# Patient Record
Sex: Female | Born: 1980 | Race: White | Hispanic: No | Marital: Single | Smoking: Never smoker
Health system: Southern US, Community
[De-identification: ages and names within clinical notes are randomized; demographics above are authoritative.]

## PROBLEM LIST (undated history)

## (undated) DIAGNOSIS — C439 Malignant melanoma of skin, unspecified: Secondary | ICD-10-CM

## (undated) DIAGNOSIS — T7840XA Allergy, unspecified, initial encounter: Secondary | ICD-10-CM

## (undated) DIAGNOSIS — J45909 Unspecified asthma, uncomplicated: Secondary | ICD-10-CM

## (undated) HISTORY — PX: TONSILLECTOMY: SHX5217

## (undated) HISTORY — DX: Allergy, unspecified, initial encounter: T78.40XA

## (undated) HISTORY — DX: Malignant melanoma of skin, unspecified: C43.9

## (undated) HISTORY — PX: TYMPANOSTOMY TUBE PLACEMENT: SHX32

## (undated) HISTORY — DX: Unspecified asthma, uncomplicated: J45.909

---

## 2000-08-14 ENCOUNTER — Other Ambulatory Visit: Admission: RE | Admit: 2000-08-14 | Discharge: 2000-08-14 | Payer: Self-pay | Admitting: Gynecology

## 2001-07-25 ENCOUNTER — Other Ambulatory Visit: Admission: RE | Admit: 2001-07-25 | Discharge: 2001-07-25 | Payer: Self-pay | Admitting: Gynecology

## 2003-11-10 ENCOUNTER — Emergency Department (HOSPITAL_COMMUNITY): Admission: EM | Admit: 2003-11-10 | Discharge: 2003-11-10 | Payer: Self-pay | Admitting: Emergency Medicine

## 2003-11-10 ENCOUNTER — Inpatient Hospital Stay (HOSPITAL_COMMUNITY): Admission: RE | Admit: 2003-11-10 | Discharge: 2003-11-23 | Payer: Self-pay | Admitting: Psychiatry

## 2003-11-11 ENCOUNTER — Ambulatory Visit (HOSPITAL_COMMUNITY): Admission: RE | Admit: 2003-11-11 | Discharge: 2003-11-11 | Payer: Self-pay | Admitting: Psychiatry

## 2003-11-24 ENCOUNTER — Other Ambulatory Visit (HOSPITAL_COMMUNITY): Admission: RE | Admit: 2003-11-24 | Discharge: 2003-11-28 | Payer: Self-pay | Admitting: Psychiatry

## 2003-12-12 ENCOUNTER — Encounter: Admission: RE | Admit: 2003-12-12 | Discharge: 2003-12-12 | Payer: Self-pay | Admitting: Psychiatry

## 2011-11-17 ENCOUNTER — Telehealth: Payer: Self-pay

## 2011-11-17 MED ORDER — BECLOMETHASONE DIPROPIONATE 40 MCG/ACT IN AERS: 1.0000 | INHALATION_SPRAY | Freq: Two times a day (BID) | RESPIRATORY_TRACT | Status: DC

## 2011-11-17 MED ORDER — AMPHETAMINE-DEXTROAMPHET ER 20 MG PO CP24: 20.0000 mg | ORAL_CAPSULE | ORAL | Status: DC

## 2011-11-17 NOTE — Telephone Encounter (Signed)
Chart is at nurses station for review MR [id number]

## 2011-11-17 NOTE — Telephone Encounter (Signed)
DEA database clear.  Adderall Rx done and ready for pick up. Steroid inhaler changed, she did not give [place] a pharmacy to send it to so please call it in.  Lauren Gibbs

## 2011-11-17 NOTE — Telephone Encounter (Signed)
Pt wants refill of Adderall. Also needs to change inhaler as ins will no longer cover Pulmacort. Needs a generic daily steroid inhaler (not a rescue).

## 2011-11-19 NOTE — Telephone Encounter (Signed)
lmom for pt to cb with pharmacy.  adderall rx is in pickup drawer

## 2011-11-20 ENCOUNTER — Other Ambulatory Visit: Payer: Self-pay | Admitting: *Deleted

## 2011-11-20 MED ORDER — BECLOMETHASONE DIPROPIONATE 40 MCG/ACT IN AERS: 1.0000 | INHALATION_SPRAY | Freq: Two times a day (BID) | RESPIRATORY_TRACT | Status: DC

## 2011-11-20 NOTE — Telephone Encounter (Signed)
NOTED ON RX IN PICKUP DRAWER TO LET us KNOW WHAT PHARMACY TO CALL HER INHALER INTO

## 2012-03-02 ENCOUNTER — Ambulatory Visit (INDEPENDENT_AMBULATORY_CARE_PROVIDER_SITE_OTHER): Payer: BC Managed Care – PPO | Admitting: Family Medicine

## 2012-03-02 ENCOUNTER — Encounter: Payer: Self-pay | Admitting: Family Medicine

## 2012-03-02 VITALS — BP 102/70 | HR 88 | Temp 99.7°F | Resp 16 | Ht 71.0 in | Wt 160.0 lb

## 2012-03-02 DIAGNOSIS — F988 Other specified behavioral and emotional disorders with onset usually occurring in childhood and adolescence: Secondary | ICD-10-CM

## 2012-03-02 DIAGNOSIS — J309 Allergic rhinitis, unspecified: Secondary | ICD-10-CM | POA: Insufficient documentation

## 2012-03-02 DIAGNOSIS — J45909 Unspecified asthma, uncomplicated: Secondary | ICD-10-CM

## 2012-03-02 MED ORDER — AMPHETAMINE-DEXTROAMPHET ER 20 MG PO CP24: 20.0000 mg | ORAL_CAPSULE | ORAL | Status: DC

## 2012-03-02 MED ORDER — FLUTICASONE PROPIONATE 50 MCG/ACT NA SUSP: 2.0000 | Freq: Every day | NASAL | Status: DC

## 2012-03-02 MED ORDER — ALBUTEROL SULFATE HFA 108 (90 BASE) MCG/ACT IN AERS: 2.0000 | INHALATION_SPRAY | Freq: Four times a day (QID) | RESPIRATORY_TRACT | Status: DC | PRN

## 2012-03-02 MED ORDER — MONTELUKAST SODIUM 10 MG PO TABS: 10.0000 mg | ORAL_TABLET | Freq: Every day | ORAL | Status: DC

## 2012-03-02 NOTE — Progress Notes (Signed)
  Subjective:    Patient ID: Lauren Gibbs, female    DOB: 1980-11-05, 31 y.o.   MRN: [id number]  HPI   This 31 y.o Cauc female has Asthma and ADD. She uses her rescue MDI 2-3x/week. She has   had some increased SOB this summer and would benefit from steroid MDI but cannot afford it (she  has not met deductible with her current insurance). She has a lot of infraorbital puffiness and nasal  congestion; used Flonase in the past which was very effective. Wants to restart this med if it is available  as a generic.   ADD medication helps her with job performance; she takes medication only on the days that she needs   it (her schedule is variable).    Review of Systems  Constitutional: Negative for appetite change, fatigue and unexpected weight change.  HENT: Positive for congestion, rhinorrhea, sneezing and sinus pressure. Negative for sore throat, trouble swallowing and postnasal drip.   Eyes: Positive for redness.       Eye puffiness  Respiratory: Positive for cough and shortness of breath. Negative for chest tightness and wheezing.   Cardiovascular: Negative for chest pain and palpitations.  Neurological: Negative.   Psychiatric/Behavioral: Negative.        Objective:   Physical Exam  Nursing note and vitals reviewed. Constitutional: She is oriented to person, place, and time. She appears well-developed and well-nourished. No distress.  HENT:  Head: Normocephalic and atraumatic.  Right Ear: Hearing, external ear and ear canal normal. Tympanic membrane is scarred.  Left Ear: Hearing, external ear and ear canal normal. Tympanic membrane is scarred.  Nose: Mucosal edema present. No sinus tenderness, nasal deformity or septal deviation. Right sinus exhibits no maxillary sinus tenderness and no frontal sinus tenderness. Left sinus exhibits no maxillary sinus tenderness and no frontal sinus tenderness.  Mouth/Throat: Uvula is midline, oropharynx is clear and moist and mucous membranes are  normal. No oral lesions. Normal dentition. No posterior oropharyngeal erythema.  Eyes: Conjunctivae, EOM and lids are normal. Pupils are equal, round, and reactive to light. No scleral icterus.  Neck: No mass and no thyromegaly present.  Cardiovascular: Normal rate, regular rhythm and normal heart sounds.  Exam reveals no gallop.   No murmur heard. Pulmonary/Chest: Effort normal and breath sounds normal. No respiratory distress. She has no wheezes.  Musculoskeletal: Normal range of motion.  Neurological: She is alert and oriented to person, place, and time. No cranial nerve deficit.  Skin: Skin is warm and dry. No rash noted. No erythema.  Psychiatric: She has a normal mood and affect. Her behavior is normal. Thought content normal.          Assessment & Plan:   1. Asthma in adult - RF: Albuterol MDI  2 puffs p.o. prn SOB or wheezing Trial [id number] 10 mg  1 tablet p.o. at bedtime   2. Allergic rhinitis - RF: Fluticasone NS  2 sprays in nose hs  3. ADD (attention deficit disorder) RF: Adderall XR 20 mg x 3 months

## 2012-03-02 NOTE — Patient Instructions (Addendum)

## 2012-09-24 ENCOUNTER — Other Ambulatory Visit: Payer: Self-pay | Admitting: Family Medicine

## 2012-09-28 ENCOUNTER — Ambulatory Visit (INDEPENDENT_AMBULATORY_CARE_PROVIDER_SITE_OTHER): Payer: BC Managed Care – PPO | Admitting: Family Medicine

## 2012-09-28 ENCOUNTER — Encounter: Payer: Self-pay | Admitting: Family Medicine

## 2012-09-28 VITALS — BP 108/73 | HR 80 | Temp 98.6°F | Resp 16 | Ht 71.0 in | Wt 165.0 lb

## 2012-09-28 DIAGNOSIS — J45909 Unspecified asthma, uncomplicated: Secondary | ICD-10-CM

## 2012-09-28 DIAGNOSIS — F988 Other specified behavioral and emotional disorders with onset usually occurring in childhood and adolescence: Secondary | ICD-10-CM

## 2012-09-28 MED ORDER — AMPHETAMINE-DEXTROAMPHET ER 20 MG PO CP24: 20.0000 mg | ORAL_CAPSULE | ORAL | Status: DC

## 2012-09-28 MED ORDER — MONTELUKAST SODIUM 10 MG PO TABS: ORAL_TABLET | ORAL | Status: DC

## 2012-09-28 MED ORDER — ALBUTEROL SULFATE HFA 108 (90 BASE) MCG/ACT IN AERS: 2.0000 | INHALATION_SPRAY | Freq: Four times a day (QID) | RESPIRATORY_TRACT | Status: DC | PRN

## 2012-09-28 NOTE — Progress Notes (Signed)
S:  This 32 y.o. Cauc female has Asthma and has been mildly symptomatic; She has deliberately reduced her activity level to minimize symptoms; she ran out of MDI.  She admits nocturnal cough and mild SOB w/ exertion. She had 2 episodes of "coughing spells" lasting about 1 minute where she had trouble catching her breath. She did not need to seek medical treatment. She is compliant w/ Singulair. She usually get vaccinated for Influenza but did not this past winter.   Pt takes Adderall for ADD; she had to d/c med temporarily because of insurance issues. When she did resume med, she felt a little anxious but feels better now. Appetite and sleep are normal. She continues to work in [employer] and has a part-time job to supplement that income.  ROS: As per HPI. Negative for fatigue, unexplained weight change, CP or tightness, palpitations, GI problems, HA, dizziness, weakness, agitation, tremor or behavior problems.  O:  Filed Vitals:   09/28/12 1035  BP: 108/73  Pulse: 80  Temp: 98.6 F (37 C)  Resp: 16   GEN: In NAD; WN,WD. HEENT: [place]/AT; EOMI w/ clear conj/ sclerae; EACs/TMs normal w/ mild scarring; oroph clear and moist. COR: RRR. LUNGS: CTA w/o wheezes; normal resp rate and effort. SKIN: W&D. No pallor. NUERO: A&O x 3; CNs intact. Nonfocal.  A/P:  Intrinsic asthma- RF Albuterol MDI and advised about regular use. Continue Singulair 1 tab hs.  ADD (attention deficit disorder)- stable on current med w/o side effect. RF: Adderall XR  Meds ordered this encounter  Medications  . montelukast (SINGULAIR) 10 MG tablet    Sig: Take 1 tablet by mouth at bedtime    Dispense:  30 tablet    Refill:  5  . albuterol (PROVENTIL HFA;VENTOLIN HFA) 108 (90 BASE) MCG/ACT inhaler    Sig: Inhale 2 puffs into the lungs every 6 (six) hours as needed for wheezing or shortness of breath.    Dispense:  1 Inhaler    Refill:  5  . CONTD: amphetamine-dextroamphetamine (ADDERALL XR) 20 MG 24 hr capsule    Sig: Take 1 capsule (20 mg total) by mouth every morning.    Dispense:  30 capsule    Refill:  0  . CONTD: amphetamine-dextroamphetamine (ADDERALL XR) 20 MG 24 hr capsule    Sig: Take 1 capsule (20 mg total) by mouth every morning.    Dispense:  30 capsule    Refill:  0    Do not fill before October 27, 2012.  Marland Kitchen CONTD: amphetamine-dextroamphetamine (ADDERALL XR) 20 MG 24 hr capsule    Sig: Take 1 capsule (20 mg total) by mouth every morning.    Dispense:  30 capsule    Refill:  0    Do not fill before November 27, 2012.

## 2012-12-18 ENCOUNTER — Telehealth: Payer: Self-pay

## 2012-12-18 MED ORDER — AMPHETAMINE-DEXTROAMPHET ER 20 MG PO CP24: 20.0000 mg | ORAL_CAPSULE | ORAL | Status: DC

## 2012-12-18 NOTE — Telephone Encounter (Signed)
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 ISN'T OUT OF HER MEDS RIGHT NOW, BUT WILL NEED BEFORE SHE COMES BACK. PLEASE CALL [phone]

## 2012-12-18 NOTE — Telephone Encounter (Signed)
I will prescribe medication for 2 additional months. She can pick up RXs from [facility].

## 2013-01-08 NOTE — Telephone Encounter (Signed)
Left message for Lauren Gibbs that the RX would be @ [facility]

## 2013-03-10 ENCOUNTER — Other Ambulatory Visit: Payer: Self-pay | Admitting: Family Medicine

## 2013-05-06 ENCOUNTER — Other Ambulatory Visit: Payer: Self-pay | Admitting: Family Medicine

## 2013-05-06 NOTE — Telephone Encounter (Signed)
Pt needs a refill of he asthma medication. She has called her pharmacy and they will send a fax but they advised her to call us to see if we could call it in faster. She has made An appt. To see dr. Audria Nine in two weeks to recheck. Pt's number is [phone]

## 2013-05-21 ENCOUNTER — Ambulatory Visit (INDEPENDENT_AMBULATORY_CARE_PROVIDER_SITE_OTHER): Payer: BC Managed Care – PPO | Admitting: Family Medicine

## 2013-05-21 ENCOUNTER — Encounter: Payer: Self-pay | Admitting: Family Medicine

## 2013-05-21 VITALS — BP 100/74 | HR 83 | Temp 99.5°F | Resp 16 | Ht 70.0 in | Wt 160.4 lb

## 2013-05-21 DIAGNOSIS — F988 Other specified behavioral and emotional disorders with onset usually occurring in childhood and adolescence: Secondary | ICD-10-CM

## 2013-05-21 DIAGNOSIS — Z23 Encounter for immunization: Secondary | ICD-10-CM

## 2013-05-21 DIAGNOSIS — J45909 Unspecified asthma, uncomplicated: Secondary | ICD-10-CM

## 2013-05-21 MED ORDER — FLUTICASONE PROPIONATE 50 MCG/ACT NA SUSP: 1.0000 | Freq: Every day | NASAL | Status: DC

## 2013-05-21 MED ORDER — AMPHETAMINE-DEXTROAMPHET ER 20 MG PO CP24: 20.0000 mg | ORAL_CAPSULE | ORAL | Status: DC

## 2013-05-21 MED ORDER — MONTELUKAST SODIUM 10 MG PO TABS: ORAL_TABLET | ORAL | Status: DC

## 2013-05-21 NOTE — Progress Notes (Signed)
S:  This 32 y.o. Cauc female has Asthma w/ allergic rhinitis, well controlled on current medications. Pt reports a mild exacerbation which she managed at home; it was related to cigarette smoke exposure. Montelukast is most beneficial medication; she uses nasal spray hs and Albuterol less than twice a week.   She also take generic Adderall for ADD; she reports no problems w/ this medication at current dose. Pt denies anorexia or GI upset, diaphoresis, fatigue, sleep disturbance, HA, dizziness or tremor.  Patient Active Problem List   Diagnosis Date Noted  . Asthma in adult 03/02/2012  . Allergic rhinitis 03/02/2012  . ADD (attention deficit disorder) 03/02/2012   PMHx, Soc Hx and Fam Hx reviewed. Medications reconciled.  ROS: AS per HPI.  O: Filed Vitals:   05/21/13 1416  BP: 100/74  Pulse: 83  Temp: 99.5 F (37.5 C)  Resp: 16   GEN: In NAD; WN,WD. HENT: [place]/AT; EOMI w/ clear conj/sclerae. EACs/nose/oroph unremarkable. NECK: No LAN or TMG. COR: RRR. Normal S1 and S2. No m/g/r. LUNGS: CTA. Normal resp rate and effort. ABD: Normal appearance; flat and soft. No guarding. No HSM or masses. SKIN: W&D; intact w/o rashes, erythema, jaundice or pallor. NEURO: A&O x 3; CNs intact. Nonfocal.  A/P: Asthma in adult- Stable and well controlled. No medication change.  ADD (attention deficit disorder)- Medication effective and well tolerated at current dose.  Need for prophylactic vaccination and inoculation against influenza - Plan: Flu Vaccine QUAD 36+ mos IM  Meds ordered this encounter  Medications  . fluticasone (FLONASE) 50 MCG/ACT nasal spray    Sig: Place 1 spray into the nose daily.    Dispense:  16 g    Refill:  11  . montelukast (SINGULAIR) 10 MG tablet    Sig: Take 1 tablet by mouth at bedtime    Dispense:  30 tablet    Refill:  11  . DISCONTD: amphetamine-dextroamphetamine (ADDERALL XR) 20 MG 24 hr capsule    Sig: Take 1 capsule (20 mg total) by mouth every morning.   Dispense:  30 capsule    Refill:  0  . DISCONTD: amphetamine-dextroamphetamine (ADDERALL XR) 20 MG 24 hr capsule    Sig: Take 1 capsule (20 mg total) by mouth every morning.    Dispense:  30 capsule    Refill:  0    Do not fill before June 20, 2013.  Marland Kitchen DISCONTD: amphetamine-dextroamphetamine (ADDERALL XR) 20 MG 24 hr capsule    Sig: Take 1 capsule (20 mg total) by mouth every morning.    Dispense:  30 capsule    Refill:  0    Do not fill before July 20, 2013.  Marland Kitchen amphetamine-dextroamphetamine (ADDERALL XR) 20 MG 24 hr capsule    Sig: Take 1 capsule (20 mg total) by mouth every morning.    Dispense:  30 capsule    Refill:  0    Do not fill before August 20, 2012.

## 2013-07-02 ENCOUNTER — Ambulatory Visit (INDEPENDENT_AMBULATORY_CARE_PROVIDER_SITE_OTHER): Payer: BC Managed Care – PPO | Admitting: Family Medicine

## 2013-07-02 VITALS — BP 122/76 | HR 98 | Temp 98.5°F | Resp 16 | Ht 69.5 in | Wt 162.6 lb

## 2013-07-02 DIAGNOSIS — R300 Dysuria: Secondary | ICD-10-CM

## 2013-07-02 DIAGNOSIS — R309 Painful micturition, unspecified: Secondary | ICD-10-CM

## 2013-07-02 DIAGNOSIS — R3915 Urgency of urination: Secondary | ICD-10-CM

## 2013-07-02 LAB — POCT URINALYSIS DIPSTICK
Bilirubin, UA: NEGATIVE
Glucose, UA: NEGATIVE
Ketones, UA: NEGATIVE
Nitrite, UA: NEGATIVE
Protein, UA: 100
Spec Grav, UA: >=1.03
Urobilinogen, UA: 0.2
pH, UA: 5.5

## 2013-07-02 LAB — POCT UA - MICROSCOPIC ONLY
Bacteria, U Microscopic: NEGATIVE
Crystals, Ur, HPF, POC: NEGATIVE
Epithelial cells, urine per micros: NEGATIVE
Mucus, UA: NEGATIVE
Yeast, UA: NEGATIVE

## 2013-07-02 MED ORDER — CIPROFLOXACIN HCL 500 MG PO TABS: 500.0000 mg | ORAL_TABLET | Freq: Two times a day (BID) | ORAL | Status: DC

## 2013-07-02 NOTE — Patient Instructions (Signed)
Urinary Tract Infection  Urinary tract infections (UTIs) can develop anywhere along your urinary tract. Your urinary tract is your body's drainage system for removing wastes and extra water. Your urinary tract includes two kidneys, two ureters, a bladder, and a urethra. Your kidneys are a pair of bean-shaped organs. Each kidney is about the size of your fist. They are located below your ribs, one on each side of your spine.  CAUSES  Infections are caused by microbes, which are microscopic organisms, including fungi, viruses, and bacteria. These organisms are so small that they can only be seen through a microscope. Bacteria are the microbes that most commonly cause UTIs.  SYMPTOMS   Symptoms of UTIs may vary by age and gender of the patient and by the location of the infection. Symptoms in young women typically include a frequent and intense urge to urinate and a painful, burning feeling in the bladder or urethra during urination. Older women and men are more likely to be tired, shaky, and weak and have muscle aches and abdominal pain. A fever may mean the infection is in your kidneys. Other symptoms of a kidney infection include pain in your back or sides below the ribs, nausea, and vomiting.  DIAGNOSIS  To diagnose a UTI, your caregiver will ask you about your symptoms. Your caregiver also will ask to provide a urine sample. The urine sample will be tested for bacteria and white blood cells. White blood cells are made by your body to help fight infection.  TREATMENT   Typically, UTIs can be treated with medication. Because most UTIs are caused by a bacterial infection, they usually can be treated with the use of antibiotics. The choice of antibiotic and length of treatment depend on your symptoms and the type of bacteria causing your infection.  HOME CARE INSTRUCTIONS   If you were prescribed antibiotics, take them exactly as your caregiver instructs you. Finish the medication even if you feel better after you  have only taken some of the medication.   Drink enough water and fluids to keep your urine clear or pale yellow.   Avoid caffeine, tea, and carbonated beverages. They tend to irritate your bladder.   Empty your bladder often. Avoid holding urine for long periods of time.   Empty your bladder before and after sexual intercourse.   After a bowel movement, women should cleanse from front to back. Use each tissue only once.  SEEK MEDICAL CARE IF:    You have back pain.   You develop a fever.   Your symptoms do not begin to resolve within 3 days.  SEEK IMMEDIATE MEDICAL CARE IF:    You have severe back pain or lower abdominal pain.   You develop chills.   You have nausea or vomiting.   You have continued burning or discomfort with urination.  MAKE SURE YOU:    Understand these instructions.   Will watch your condition.   Will get help right away if you are not doing well or get worse.  Document Released: 05/04/2005 Document Revised: 01/24/2012 Document Reviewed: 09/02/2011  [employer] Patient Information 2014 [employer].

## 2013-07-02 NOTE — Progress Notes (Signed)
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  No h/o frequent UTI  Objective:  NAD  Results for orders placed in visit on 07/02/13  POCT URINALYSIS DIPSTICK      Result Value Range   Color, UA yellow     Clarity, UA cloudy     Glucose, UA neg     Bilirubin, UA neg     Ketones, UA neg     Spec Grav, UA >=1.030     Blood, UA large     pH, UA 5.5     Protein, UA 100     Urobilinogen, UA 0.2     Nitrite, UA neg     Leukocytes, UA large (3+)    POCT UA - MICROSCOPIC ONLY      Result Value Range   WBC, Ur, HPF, POC tntc     RBC, urine, microscopic tntc     Bacteria, U Microscopic neg     Mucus, UA neg     Epithelial cells, urine per micros neg     Crystals, Ur, HPF, POC neg     Casts, Ur, LPF, POC renal tubular     Yeast, UA neg     Assessment:  UTI Urgency of urination - Plan: POCT urinalysis dipstick, POCT UA - Microscopic Only, Urine culture  Painful urination - Plan: POCT urinalysis dipstick, POCT UA - Microscopic Only, Urine culture  Signed, Elvina Sidle, MD

## 2013-07-03 LAB — URINE CULTURE
Colony Count: NO GROWTH
Organism ID, Bacteria: NO GROWTH

## 2013-07-06 ENCOUNTER — Telehealth: Payer: Self-pay

## 2013-07-06 MED ORDER — FLUCONAZOLE 150 MG PO TABS: 150.0000 mg | ORAL_TABLET | Freq: Once | ORAL | Status: DC

## 2013-07-06 NOTE — Telephone Encounter (Signed)
Yeast infection from medications, needs the pills Diflucan   [facility] on [address] at [place]  [phone]

## 2013-07-06 NOTE — Telephone Encounter (Signed)
Called pt, advised Rx sent the pharmacy.

## 2013-07-06 NOTE — Telephone Encounter (Signed)
Sent to the pharmacy

## 2014-01-24 ENCOUNTER — Encounter: Payer: Self-pay | Admitting: Family Medicine

## 2014-01-24 ENCOUNTER — Ambulatory Visit (INDEPENDENT_AMBULATORY_CARE_PROVIDER_SITE_OTHER): Payer: BC Managed Care – PPO | Admitting: Family Medicine

## 2014-01-24 VITALS — BP 125/75 | HR 69 | Temp 98.4°F | Resp 16 | Ht 69.5 in | Wt 166.8 lb

## 2014-01-24 DIAGNOSIS — F988 Other specified behavioral and emotional disorders with onset usually occurring in childhood and adolescence: Secondary | ICD-10-CM

## 2014-01-24 MED ORDER — AMPHETAMINE-DEXTROAMPHET ER 20 MG PO CP24: 20.0000 mg | ORAL_CAPSULE | ORAL | Status: DC

## 2014-01-26 ENCOUNTER — Encounter: Payer: Self-pay | Admitting: Family Medicine

## 2014-01-26 NOTE — Progress Notes (Signed)
S:  This 33 y.o. Cauc female has ADD, well controlled on medication taken once daily as needed. 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 Pt has no adverse medication effects (anorexia, abnormal weigh loss, diaphoresis, CP or tightness, palpitations, SOB, GI problems, rashes, HA, dizziness, weakness or sleep disturbance).  Patient Active Problem List   Diagnosis Date Noted  . Asthma in adult 03/02/2012  . Allergic rhinitis 03/02/2012  . ADD (attention deficit disorder) 03/02/2012    Prior to Admission medications   Medication Sig Start Date End Date Taking? Authorizing Provider  amphetamine-dextroamphetamine (ADDERALL XR) 20 MG 24 hr capsule Take 1 capsule (20 mg total) by mouth every morning.   Yes Barton Fanny, MD  fluticasone [facility]) 50 MCG/ACT nasal spray Place 1 spray into the nose daily. 05/21/13  Yes Barton Fanny, MD  montelukast (SINGULAIR) 10 MG tablet Take 1 tablet by mouth at bedtime 05/21/13  Yes Barton Fanny, MD  albuterol (PROVENTIL HFA;VENTOLIN HFA) 108 (90 BASE) MCG/ACT inhaler Inhale 2 puffs into the lungs every 6 (six) hours as needed for wheezing or shortness of breath. 09/28/12 09/28/13  Barton Fanny, MD   PMHx, Surg Hx, Soc and Fam Hx reviewed.  ROS: As per HPI.  O: Filed Vitals:   01/24/14 1600  BP: 125/75  Pulse: 69  Temp: 98.4 F (36.9 C)  Resp: 16   GEN: In NAD; WN,WD. Weight is up 6 lbs. HENT: [place]?AT; EOMI w/ clear conj/sclerae. Otherwise unremarkable. COR: RRR. LUNGS: Normal resp rate and effort. SKIN: W&D; intact w/o diaphoresis, erythema or pallor. MS: MAEs; no deformities, muscle atrophy or edema. NEURO: A&O x 3; CNs intact. Nonfocal.  A/P: ADD (attention deficit disorder)- Continue current medication; contact clinic in 3 months for 50-month refills. Sch CPE/ labs- 6 months.  Meds ordered this encounter  Medications  . amphetamine-dextroamphetamine (ADDERALL XR) 20 MG 24 hr capsule    Sig:  Take 1 capsule (20 mg total) by mouth every morning.    Dispense:  30 capsule    Refill:  0  . amphetamine-dextroamphetamine (ADDERALL XR) 20 MG 24 hr capsule    Sig: Take 1 capsule (20 mg total) by mouth every morning.    Dispense:  30 capsule    Refill:  0    May fill on or after February 23, 2014.  Marland Kitchen amphetamine-dextroamphetamine (ADDERALL XR) 20 MG 24 hr capsule    Sig: Take 1 capsule (20 mg total) by mouth every morning.    Dispense:  30 capsule    Refill:  0    May fill on or after March 26, 2014.

## 2014-06-16 ENCOUNTER — Other Ambulatory Visit: Payer: Self-pay | Admitting: Family Medicine

## 2014-06-18 ENCOUNTER — Telehealth: Payer: Self-pay

## 2014-06-18 NOTE — Telephone Encounter (Signed)
Pt states that she really needs a refill on Singular and Flonase, she has scheduled an appt for 06/27/14. She is aware that she needs to come in for an OV in order for her meds to be refilled. [facility]#[phone]

## 2014-06-18 NOTE — Telephone Encounter (Signed)
Refills have been sent to the pharmacy 

## 2014-06-27 ENCOUNTER — Encounter: Payer: Self-pay | Admitting: Family Medicine

## 2014-06-27 ENCOUNTER — Ambulatory Visit (INDEPENDENT_AMBULATORY_CARE_PROVIDER_SITE_OTHER): Payer: BC Managed Care – PPO | Admitting: Family Medicine

## 2014-06-27 VITALS — BP 98/62 | HR 79 | Temp 98.9°F | Resp 16 | Ht 69.5 in | Wt 177.0 lb

## 2014-06-27 DIAGNOSIS — J4520 Mild intermittent asthma, uncomplicated: Secondary | ICD-10-CM

## 2014-06-27 DIAGNOSIS — Z23 Encounter for immunization: Secondary | ICD-10-CM

## 2014-06-27 DIAGNOSIS — J45909 Unspecified asthma, uncomplicated: Secondary | ICD-10-CM | POA: Insufficient documentation

## 2014-06-27 DIAGNOSIS — G4720 Circadian rhythm sleep disorder, unspecified type: Secondary | ICD-10-CM

## 2014-06-27 DIAGNOSIS — F988 Other specified behavioral and emotional disorders with onset usually occurring in childhood and adolescence: Secondary | ICD-10-CM

## 2014-06-27 DIAGNOSIS — F909 Attention-deficit hyperactivity disorder, unspecified type: Secondary | ICD-10-CM

## 2014-06-27 DIAGNOSIS — R635 Abnormal weight gain: Secondary | ICD-10-CM

## 2014-06-27 DIAGNOSIS — G479 Sleep disorder, unspecified: Secondary | ICD-10-CM

## 2014-06-27 LAB — CBC WITH DIFFERENTIAL/PLATELET
Basophils Absolute: 0 10*3/uL (ref 0.0–0.1)
Basophils Relative: 0 % (ref 0–1)
Eosinophils Absolute: 0.1 10*3/uL (ref 0.0–0.7)
Eosinophils Relative: 2 % (ref 0–5)
HCT: 39.9 % (ref 36.0–46.0)
Hemoglobin: 13.9 g/dL (ref 12.0–15.0)
Lymphocytes Relative: 32 % (ref 12–46)
Lymphs Abs: 2.3 10*3/uL (ref 0.7–4.0)
MCH: 29 pg (ref 26.0–34.0)
MCHC: 34.8 g/dL (ref 30.0–36.0)
MCV: 83.3 fL (ref 78.0–100.0)
MPV: 8.6 fL — ABNORMAL LOW (ref 9.4–12.4)
Monocytes Absolute: 0.4 10*3/uL (ref 0.1–1.0)
Monocytes Relative: 5 % (ref 3–12)
Neutro Abs: 4.3 10*3/uL (ref 1.7–7.7)
Neutrophils Relative %: 61 % (ref 43–77)
Platelets: 247 10*3/uL (ref 150–400)
RBC: 4.79 MIL/uL (ref 3.87–5.11)
RDW: 12.7 % (ref 11.5–15.5)
WBC: 7.1 10*3/uL (ref 4.0–10.5)

## 2014-06-27 LAB — COMPLETE METABOLIC PANEL WITH GFR
ALT: 25 U/L (ref 0–35)
AST: 28 U/L (ref 0–37)
Albumin: 4.4 g/dL (ref 3.5–5.2)
Alkaline Phosphatase: 77 U/L (ref 39–117)
BUN: 11 mg/dL (ref 6–23)
CO2: 27 mEq/L (ref 19–32)
Calcium: 9.2 mg/dL (ref 8.4–10.5)
Chloride: 102 mEq/L (ref 96–112)
Creat: 0.65 mg/dL (ref 0.50–1.10)
GFR, Est African American: >89 mL/min
GFR, Est Non African American: >89 mL/min
Glucose, Bld: 141 mg/dL — ABNORMAL HIGH (ref 70–99)
Potassium: 3.8 mEq/L (ref 3.5–5.3)
Sodium: 141 mEq/L (ref 135–145)
Total Bilirubin: 0.4 mg/dL (ref 0.2–1.2)
Total Protein: 6.8 g/dL (ref 6.0–8.3)

## 2014-06-27 MED ORDER — AMPHETAMINE-DEXTROAMPHET ER 20 MG PO CP24: 20.0000 mg | ORAL_CAPSULE | ORAL | Status: DC

## 2014-06-27 MED ORDER — MONTELUKAST SODIUM 10 MG PO TABS: 10.0000 mg | ORAL_TABLET | Freq: Every day | ORAL | Status: DC

## 2014-06-27 MED ORDER — ALBUTEROL SULFATE HFA 108 (90 BASE) MCG/ACT IN AERS: 2.0000 | INHALATION_SPRAY | Freq: Four times a day (QID) | RESPIRATORY_TRACT | Status: AC | PRN

## 2014-06-27 MED ORDER — FLUTICASONE PROPIONATE 50 MCG/ACT NA SUSP: 1.0000 | Freq: Every day | NASAL | Status: DC

## 2014-06-27 NOTE — Patient Instructions (Addendum)
Your lab results will be available by Monday.  The clinic will contact you with the results.  For Adderall refills- you can call the office in 3 months for your next 54-month supply. Your next appointment should be for a physical exam in 6 months.  Enjoy The Holidays!

## 2014-06-27 NOTE — Progress Notes (Signed)
Subjective:    Patient ID: Lauren Gibbs, female    DOB: 13-May-1981, 33 y.o.   MRN: [id number]  HPI  This 33 y.o. Cauc female has ADD, effectively treated w/ Adderall w/o adverse effects. 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   She c/o fatigue and low energy; this is somewhat new and she has weight gain that is not attributable to increased appetite or lack of physical activity. A recent trip to [place] w/ her mother was very enjoyable and they hiked down into the [place]. Her stamina was good; Asthma was quiescent and she did not have to use albuterol MDI. Compliance w/ other chronic medications is good.  Asthma- albuterol use is less than 2x/week. Mild intermittent cough but no nocturnal symptoms. No wheezing, SOB or DOE, chest tightness, palpitations or dizziness.  She has chronic sleep disturbance, dating back to childhood. Sleep onset is normal but she may have awakening in the middle of the night. She can fall back to sleep but it usually takes more than 5- 10 minutes. Melatonin is not effective. She is not interested in prescription medication at this time.  Patient Active Problem List   Diagnosis Date Noted  . 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 Allergic rhinitis 03/02/2012  . ADD (attention deficit disorder) 03/02/2012    Prior to Admission medications   Medication Sig Start Date End Date Taking? Authorizing Provider  amphetamine-dextroamphetamine (ADDERALL XR) 20 MG 24 hr capsule Take 1 capsule (20 mg total) by mouth every morning.   Yes Barton Fanny, MD  fluticasone [facility]) 50 MCG/ACT nasal spray Place 1 spray into both nostrils daily.   Yes Barton Fanny, MD  montelukast (SINGULAIR) 10 MG tablet Take 1 tablet (10 mg total) by mouth at bedtime.   Yes Barton Fanny, MD  albuterol (PROVENTIL HFA;VENTOLIN HFA) 108 (90 BASE) MCG/ACT inhaler Inhale 2 puffs into the lungs every 6 (six) hours as needed for wheezing or  shortness of breath.    Barton Fanny, MD  amphetamine-dextroamphetamine (ADDERALL XR) 20 MG 24 hr capsule Take 1 capsule (20 mg total) by mouth every morning.    Barton Fanny, MD  amphetamine-dextroamphetamine (ADDERALL XR) 20 MG 24 hr capsule Take 1 capsule (20 mg total) by mouth every morning.    Barton Fanny, MD   History   Social History  . Marital Status: Single    Spouse Name: N/A    Number of Children: N/A  . Years of Education: N/A   Occupational History  . Not on file.   Social History Main Topics  . Smoking status: Never Smoker   . Smokeless tobacco: Not on file  . Alcohol Use: Yes  . Drug Use: No  . Sexual Activity: Yes   Other Topics Concern  . Not on file   Social History Narrative   Family History  Problem Relation Age of Onset  . Heart disease Maternal Grandfather   . Cancer Paternal Grandmother   . Cancer Paternal Grandfather      Review of Systems  Constitutional: Positive for fatigue and unexpected weight change. Negative for fever and appetite change.  HENT: Negative.   Eyes: Negative.   Respiratory: Negative.   Cardiovascular: Negative.   Gastrointestinal: Negative for abdominal pain, constipation and abdominal distention.  Endocrine: Negative.   Genitourinary: Negative for menstrual problem.  Musculoskeletal: Negative.   Skin: Negative.   Neurological: Negative.   Psychiatric/Behavioral: Positive for sleep disturbance.  Negative for dysphoric mood, decreased concentration and agitation. The patient is not nervous/anxious.        Objective:   Physical Exam  Constitutional: She is oriented to person, place, and time. She appears well-developed and well-nourished. No distress.  HENT:  Head: Normocephalic and atraumatic.  Right Ear: External ear normal.  Left Ear: External ear normal.  Nose: Nose normal.  Mouth/Throat: Oropharynx is clear and moist. No oropharyngeal exudate.  Eyes: Conjunctivae and EOM are normal. Pupils  are equal, round, and reactive to light. No scleral icterus.  Neck: Normal range of motion. No thyromegaly present.  Cardiovascular: Normal rate, regular rhythm and normal heart sounds.  Exam reveals no gallop and no friction rub.   No murmur heard. Pulmonary/Chest: Effort normal and breath sounds normal. No respiratory distress. She has no wheezes.  Musculoskeletal: Normal range of motion. She exhibits no edema or tenderness.  Lymphadenopathy:    She has no cervical adenopathy.  Neurological: She is alert and oriented to person, place, and time. She has normal strength. She displays no tremor. No cranial nerve deficit or sensory deficit. She exhibits normal muscle tone. Coordination and gait normal.  Reflex Scores:      Tricep reflexes are 1+ on the right side and 1+ on the left side.      Bicep reflexes are 1+ on the right side and 1+ on the left side.      Brachioradialis reflexes are 1+ on the right side and 1+ on the left side.      Patellar reflexes are 1+ on the right side and 1+ on the left side. Skin: Skin is warm and dry. No rash noted. She is not diaphoretic. No erythema. No pallor.  Psychiatric: She has a normal mood and affect. Her behavior is normal. Judgment and thought content normal.  Nursing note and vitals reviewed.      Assessment & Plan:  Abnormal weight gain - Plan: Thyroid Panel With TSH, COMPLETE METABOLIC PANEL WITH GFR, CBC with Differential, Vitamin D, 25-hydroxy  Sleep pattern disturbance- No intervention at this time; discussed possible need for Sleep Study and/or prescription medication if lifestyle begins to suffer.  Intrinsic asthma, mild intermittent, uncomplicated- Stable on montelukast and prn albuterol.  ADD- Continue current medication (Adderall XR 20 mg  1 cap daily).  Need for prophylactic vaccination and inoculation against influenza - Plan: Flu Vaccine QUAD 36+ mos IM   Meds ordered this encounter  Medications  . albuterol (PROVENTIL  HFA;VENTOLIN HFA) 108 (90 BASE) MCG/ACT inhaler    Sig: Inhale 2 puffs into the lungs every 6 (six) hours as needed for wheezing or shortness of breath.    Dispense:  1 Inhaler    Refill:  5  . fluticasone (FLONASE) 50 MCG/ACT nasal spray    Sig: Place 1 spray into both nostrils daily.    Dispense:  16 g    Refill:  11  . montelukast (SINGULAIR) 10 MG tablet    Sig: Take 1 tablet (10 mg total) by mouth at bedtime.    Dispense:  30 tablet    Refill:  11  . amphetamine-dextroamphetamine (ADDERALL XR) 20 MG 24 hr capsule    Sig: Take 1 capsule (20 mg total) by mouth every morning.    Dispense:  30 capsule    Refill:  0    May fill on or after Jul 27, 2014.  Marland Kitchen amphetamine-dextroamphetamine (ADDERALL XR) 20 MG 24 hr capsule    Sig: Take 1 capsule (20  mg total) by mouth every morning.    Dispense:  30 capsule    Refill:  0  . amphetamine-dextroamphetamine (ADDERALL XR) 20 MG 24 hr capsule    Sig: Take 1 capsule (20 mg total) by mouth every morning.    Dispense:  30 capsule    Refill:  0    May fill on or after Aug 27, 2014.

## 2014-06-28 LAB — VITAMIN D 25 HYDROXY (VIT D DEFICIENCY, FRACTURES): Vit D, 25-Hydroxy: 28 ng/mL — ABNORMAL LOW (ref 30–100)

## 2014-06-28 LAB — THYROID PANEL WITH TSH
Free Thyroxine Index: 1.7 (ref 1.4–3.8)
T3 Uptake: 26 % (ref 22–35)
T4, Total: 6.6 ug/dL (ref 4.5–12.0)
TSH: 0.947 u[IU]/mL (ref 0.350–4.500)

## 2014-07-01 NOTE — Progress Notes (Signed)
Quick Note:  Please advise pt regarding following labs... Vitamin D level is a little low; get an over-the-counter Vitamin D3 supplement 2000 units and take 1 capsule daily.  All other labs look good; blood sugar was above normal. Return to clinic just for a lab to rule out Diabetes. The test is called "A1c" and can be run in the clinic in less than 10 minutes. This test will tell us if you have impaired glucose metabolism or if we need to treat you for Diabetes. You do not have to fast to have this test done. Please return within the next 2 weeks if possible.  Copy to pt. ______

## 2014-07-02 ENCOUNTER — Encounter: Payer: Self-pay | Admitting: Radiology

## 2014-07-16 ENCOUNTER — Telehealth: Payer: Self-pay | Admitting: Radiology

## 2014-07-16 NOTE — Telephone Encounter (Signed)
I called patient, left message about labs,, she needs office visit to come in for A1C, her labs showed   Vitamin D level is a little low; get an over-the-counter Vitamin D3 supplement 2000 units and take 1 capsule daily.  All other labs look good; blood sugar was above normal. Return to clinic just for a lab to rule out Diabetes. The test is called "A1c" and can be run in the clinic in less than 10 minutes. This test will tell us if you have impaired glucose metabolism or if we need to treat you for Diabetes. You do not have to fast to have this test done. Please return within the next 2 weeks if possible.  I signed off on labs before this was complete.

## 2014-07-22 ENCOUNTER — Other Ambulatory Visit: Payer: Self-pay | Admitting: Physician Assistant

## 2015-07-06 ENCOUNTER — Telehealth: Payer: Self-pay

## 2015-07-06 NOTE — Telephone Encounter (Signed)
Please refill for three months Jonelle Sidle and I will cosign.  Thanks for your hard work at [employer].  Philis Fendt, MS, PA-C 9:12 PM, 07/06/2015

## 2015-07-06 NOTE — Telephone Encounter (Signed)
Pt is requesting a refill of: montelukast (SINGULAIR) 10 MG tablet and her nasal spray.  Pt's last office visit was over a year ago, but she cannot afford to come in for an OV right now because she is out of work due to an injury. Can we go ahead and refill without an OV?

## 2015-07-07 MED ORDER — MONTELUKAST SODIUM 10 MG PO TABS: 10.0000 mg | ORAL_TABLET | Freq: Every day | ORAL | Status: DC

## 2015-07-07 MED ORDER — FLUTICASONE PROPIONATE 50 MCG/ACT NA SUSP: 1.0000 | Freq: Every day | NASAL | Status: DC

## 2015-07-07 NOTE — Telephone Encounter (Signed)
Left message on VM Rx's sent in.

## 2015-08-14 ENCOUNTER — Ambulatory Visit (INDEPENDENT_AMBULATORY_CARE_PROVIDER_SITE_OTHER): Payer: BLUE CROSS/BLUE SHIELD | Admitting: Physician Assistant

## 2015-08-14 VITALS — BP 118/72 | HR 108 | Temp 100.3°F | Resp 18 | Wt 175.0 lb

## 2015-08-14 DIAGNOSIS — M79674 Pain in right toe(s): Secondary | ICD-10-CM | POA: Diagnosis not present

## 2015-08-14 DIAGNOSIS — J069 Acute upper respiratory infection, unspecified: Secondary | ICD-10-CM | POA: Diagnosis not present

## 2015-08-14 DIAGNOSIS — R05 Cough: Secondary | ICD-10-CM

## 2015-08-14 DIAGNOSIS — R0600 Dyspnea, unspecified: Secondary | ICD-10-CM

## 2015-08-14 DIAGNOSIS — R059 Cough, unspecified: Secondary | ICD-10-CM

## 2015-08-14 LAB — POCT CBC
Granulocyte percent: 74.2 %G (ref 37–80)
HCT, POC: 42.2 % (ref 37.7–47.9)
Hemoglobin: 14.5 g/dL (ref 12.2–16.2)
Lymph, poc: 1.4 (ref 0.6–3.4)
MCH, POC: 29.3 pg (ref 27–31.2)
MCHC: 34.4 g/dL (ref 31.8–35.4)
MCV: 85.3 fL (ref 80–97)
MID (cbc): 0.5 (ref 0–0.9)
MPV: 6.3 fL (ref 0–99.8)
POC Granulocyte: 5.3 (ref 2–6.9)
POC LYMPH PERCENT: 19.1 %L (ref 10–50)
POC MID %: 6.7 %M (ref 0–12)
Platelet Count, POC: 253 10*3/uL (ref 142–424)
RBC: 4.95 M/uL (ref 4.04–5.48)
RDW, POC: 12.5 %
WBC: 7.1 10*3/uL (ref 4.6–10.2)

## 2015-08-14 MED ORDER — BENZONATATE 100 MG PO CAPS: 100.0000 mg | ORAL_CAPSULE | Freq: Three times a day (TID) | ORAL | Status: DC | PRN

## 2015-08-14 MED ORDER — HYDROCOD POLST-CPM POLST ER 10-8 MG/5ML PO SUER: 5.0000 mL | Freq: Every evening | ORAL | Status: DC | PRN

## 2015-08-14 MED ORDER — GUAIFENESIN ER 1200 MG PO TB12: 1.0000 | ORAL_TABLET | Freq: Two times a day (BID) | ORAL | Status: DC | PRN

## 2015-08-14 NOTE — Patient Instructions (Signed)
Upper Respiratory Infection, Adult Most upper respiratory infections (URIs) are a viral infection of the air passages leading to the lungs. A URI affects the nose, throat, and upper air passages. The most common type of URI is nasopharyngitis and is typically referred to as "the common cold." URIs run their course and usually go away on their own. Most of the time, a URI does not require medical attention, but sometimes a bacterial infection in the upper airways can follow a viral infection. This is called a secondary infection. Sinus and middle ear infections are common types of secondary upper respiratory infections. Bacterial pneumonia can also complicate a URI. A URI can worsen asthma and chronic obstructive pulmonary disease (COPD). Sometimes, these complications can require emergency medical care and may be life threatening.  CAUSES Almost all URIs are caused by viruses. A virus is a type of germ and can spread from one person to another.  RISKS FACTORS You may be at risk for a URI if:   You smoke.   You have chronic heart or lung disease.  You have a weakened defense (immune) system.   You are very young or very old.   You have nasal allergies or asthma.  You work in crowded or poorly ventilated areas.  You work in health care facilities or schools. SIGNS AND SYMPTOMS  Symptoms typically develop 2-3 days after you come in contact with a cold virus. Most viral URIs last 7-10 days. However, viral URIs from the influenza virus (flu virus) can last 14-18 days and are typically more severe. Symptoms may include:   Runny or stuffy (congested) nose.   Sneezing.   Cough.   Sore throat.   Headache.   Fatigue.   Fever.   Loss of appetite.   Pain in your forehead, behind your eyes, and over your cheekbones (sinus pain).  Muscle aches.  DIAGNOSIS  Your health care provider may diagnose a URI by:  Physical exam.  Tests to check that your symptoms are not due to  another condition such as:  Strep throat.  Sinusitis.  Pneumonia.  Asthma. TREATMENT  A URI goes away on its own with time. It cannot be cured with medicines, but medicines may be prescribed or recommended to relieve symptoms. Medicines may help:  Reduce your fever.  Reduce your cough.  Relieve nasal congestion. HOME CARE INSTRUCTIONS   Take medicines only as directed by your health care provider.   Gargle warm saltwater or take cough drops to comfort your throat as directed by your health care provider.  Use a warm mist humidifier or inhale steam from a shower to increase air moisture. This may make it easier to breathe.  Drink enough fluid to keep your urine clear or pale yellow.   Eat soups and other clear broths and maintain good nutrition.   Rest as needed.   Return to work when your temperature has returned to normal or as your health care provider advises. You may need to stay home longer to avoid infecting others. You can also use a face mask and careful hand washing to prevent spread of the virus.  Increase the usage of your inhaler if you have asthma.   Do not use any tobacco products, including cigarettes, chewing tobacco, or electronic cigarettes. If you need help quitting, ask your health care provider. PREVENTION  The best way to protect yourself from getting a cold is to practice good hygiene.   Avoid oral or hand contact with people with cold   symptoms.   Wash your hands often if contact occurs.  There is no clear evidence that vitamin C, vitamin E, echinacea, or exercise reduces the chance of developing a cold. However, it is always recommended to get plenty of rest, exercise, and practice good nutrition.  SEEK MEDICAL CARE IF:   You are getting worse rather than better.   Your symptoms are not controlled by medicine.   You have chills.  You have worsening shortness of breath.  You have brown or red mucus.  You have yellow or brown nasal  discharge.  You have pain in your face, especially when you bend forward.  You have a fever.  You have swollen neck glands.  You have pain while swallowing.  You have white areas in the back of your throat. SEEK IMMEDIATE MEDICAL CARE IF:   You have severe or persistent:  Headache.  Ear pain.  Sinus pain.  Chest pain.  You have chronic lung disease and any of the following:  Wheezing.  Prolonged cough.  Coughing up blood.  A change in your usual mucus.  You have a stiff neck.  You have changes in your:  Vision.  Hearing.  Thinking.  Mood. MAKE SURE YOU:   Understand these instructions.  Will watch your condition.  Will get help right away if you are not doing well or get worse.   This information is not intended to replace advice given to you by your health care provider. Make sure you discuss any questions you have with your health care provider.   Document Released: 01/18/2001 Document Revised: 12/09/2014 Document Reviewed: 10/30/2013 Elsevier Interactive Patient Education 2016 Elsevier Inc.  

## 2015-08-14 NOTE — Progress Notes (Signed)
[facility] [address], [place] [address] [phone]  Date:  08/14/2015   Name:  Lauren Gibbs   DOB:  March 27, 1981   MRN:  [id number]  PCP:  No primary care provider on file.    History of Present Illness:  Lauren Gibbs is a 35 y.o. female patient who presents to [facility] for cc of cough, and dysequilibrium.  Yesterday started feeling as if something is rattling at the center of chest.  She is exercising daily.  She tries to cough it up but can't.  No fever, sorethroat, nasal congestion.  She has cough that is non-productive.  Cough is worse at night.  She feels as if she can not get a full breath.   She used her albuterol which did not help.However, she was able to exercise today with yoga--acknowledging feeling very fatigued with workout.  She also swam--which she reported no difficulty or complication. Swollen along right toe with tenderness along the 1st toe with soreness.  She fractured the 5th toe of left foot 04/2015.  Recalls heavy swelling throughout the foot and toes.  She is able to perform her yoga, but has to adjust for the pain when direct pressure is placed on the great toe.     Patient Active Problem List   Diagnosis Date Noted  . 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 Allergic rhinitis 03/02/2012  . ADD (attention deficit disorder) 03/02/2012    Past Medical History  Diagnosis Date  . Allergy   . Asthma     Past Surgical History  Procedure Laterality Date  . Tonsillectomy    . Tympanostomy tube placement      Social History  Substance Use Topics  . Smoking status: Never Smoker   . Smokeless tobacco: None  . Alcohol Use: Yes    Family History  Problem Relation Age of Onset  . Heart disease Maternal Grandfather   . Cancer Paternal Grandmother   . Cancer Paternal Grandfather     Allergies  Allergen Reactions  . Amoxicillin Rash  . Ceclor [Cefaclor] Rash  . Keflex [Cephalexin] Rash  . Sulfa Antibiotics Rash    Medication list has  been reviewed and updated.  Current Outpatient Prescriptions on File Prior to Visit  Medication Sig Dispense Refill  . albuterol (PROVENTIL HFA;VENTOLIN HFA) 108 (90 BASE) MCG/ACT inhaler Inhale 2 puffs into the lungs every 6 (six) hours as needed for wheezing or shortness of breath. 1 Inhaler 5  . fluticasone (FLONASE) 50 MCG/ACT nasal spray Place 1 spray into both nostrils daily. 16 g 2  . montelukast (SINGULAIR) 10 MG tablet Take 1 tablet (10 mg total) by mouth at bedtime. 30 tablet 2  . amphetamine-dextroamphetamine (ADDERALL XR) 20 MG 24 hr capsule Take 1 capsule (20 mg total) by mouth every morning. (Patient not taking: Reported on 08/14/2015) 30 capsule 0  . amphetamine-dextroamphetamine (ADDERALL XR) 20 MG 24 hr capsule Take 1 capsule (20 mg total) by mouth every morning. (Patient not taking: Reported on 08/14/2015) 30 capsule 0  . amphetamine-dextroamphetamine (ADDERALL XR) 20 MG 24 hr capsule Take 1 capsule (20 mg total) by mouth every morning. (Patient not taking: Reported on 08/14/2015) 30 capsule 0   No current facility-administered medications on file prior to visit.    ROS ROS otherwise unremarkable unless listed above.  Physical Examination: BP 118/72 mmHg  Pulse 108  Temp(Src) 99.8 F (37.7 C) (Oral)  Resp 18  Wt 175 lb (79.379 kg)  SpO2 98%  Ideal Body Weight:    Physical Exam  Constitutional: She is oriented to person, place, and time. She appears well-developed and well-nourished. No distress.  HENT:  Head: Normocephalic and atraumatic.  Right Ear: Tympanic membrane, external ear and ear canal normal.  Left Ear: Tympanic membrane, external ear and ear canal normal.  Nose: Mucosal edema and rhinorrhea present. Right sinus exhibits no maxillary sinus tenderness and no frontal sinus tenderness. Left sinus exhibits no maxillary sinus tenderness and no frontal sinus tenderness.  Mouth/Throat: No uvula swelling. No oropharyngeal exudate, posterior oropharyngeal edema or  posterior oropharyngeal erythema.  Eyes: Conjunctivae and EOM are normal. Pupils are equal, round, and reactive to light.  Cardiovascular: Normal rate and regular rhythm.  Exam reveals no gallop, no distant heart sounds and no friction rub.   No murmur heard. Pulmonary/Chest: Effort normal. No apnea. No respiratory distress. She has no decreased breath sounds. She has no wheezes. She has no rhonchi.  Very minimal wheezing.  Musculoskeletal:  Minimal swelling without redness detected just proximal to the nail bed comparatively though not significant.  Minimal tenderness along the lateral edge.  Normal range of motion.  Lymphadenopathy:       Head (right side): No submandibular, no tonsillar, no preauricular and no posterior auricular adenopathy present.       Head (left side): No submandibular, no tonsillar, no preauricular and no posterior auricular adenopathy present.  Neurological: She is alert and oriented to person, place, and time.  Skin: Skin is warm and dry. She is not diaphoretic. No erythema.  Psychiatric: She has a normal mood and affect. Her behavior is normal.    Results for orders placed or performed in visit on 08/14/15  POCT CBC  Result Value Ref Range   WBC 7.1 4.6 - 10.2 K/uL   Lymph, poc 1.4 0.6 - 3.4   POC LYMPH PERCENT 19.1 10 - 50 %L   MID (cbc) 0.5 0 - 0.9   POC MID % 6.7 0 - 12 %M   POC Granulocyte 5.3 2 - 6.9   Granulocyte percent 74.2 37 - 80 %G   RBC 4.95 4.04 - 5.48 M/uL   Hemoglobin 14.5 12.2 - 16.2 g/dL   HCT, POC 42.2 37.7 - 47.9 %   MCV 85.3 80 - 97 fL   MCH, POC 29.3 27 - 31.2 pg   MCHC 34.4 31.8 - 35.4 g/dL   RDW, POC 12.5 %   Platelet Count, POC 253 142 - 424 K/uL   MPV 6.3 0 - 99.8 fL    Assessment and Plan: Lauren Gibbs is a 35 y.o. female who is here today for cough. This appears viral at this time.  i am advising that she use her albuterol and treating supportively to avoid asthma exacerbation.  Anti-pyretic discussed  For fever. She will  contact me if she is not feeling better within the next 7 days. She declines xr of foot at this time.  She will ice, and return if this does not improve.   Acute upper respiratory infection - Plan: chlorpheniramine-HYDROcodone (TUSSIONEX PENNKINETIC ER) 10-8 MG/5ML SUER, Guaifenesin (MUCINEX MAXIMUM STRENGTH) 1200 MG TB12  Cough - Plan: POCT CBC, benzonatate (TESSALON) 100 MG capsule, chlorpheniramine-HYDROcodone (TUSSIONEX PENNKINETIC ER) 10-8 MG/5ML SUER  Dyspnea - Plan: POCT CBC, benzonatate (TESSALON) 100 MG capsule, chlorpheniramine-HYDROcodone (TUSSIONEX PENNKINETIC ER) 10-8 MG/5ML SUER  Ivar Drape, PA-C [facility] 08/14/2015 11:49 AM

## 2015-08-15 ENCOUNTER — Encounter: Payer: Self-pay | Admitting: Physician Assistant

## 2015-10-10 ENCOUNTER — Other Ambulatory Visit: Payer: Self-pay | Admitting: Physician Assistant

## 2015-11-16 ENCOUNTER — Other Ambulatory Visit: Payer: Self-pay | Admitting: Physician Assistant

## 2016-02-20 ENCOUNTER — Other Ambulatory Visit: Payer: Self-pay | Admitting: Physician Assistant

## 2016-08-26 ENCOUNTER — Encounter: Payer: Self-pay | Admitting: Physician Assistant

## 2016-08-26 ENCOUNTER — Ambulatory Visit (INDEPENDENT_AMBULATORY_CARE_PROVIDER_SITE_OTHER): Payer: BLUE CROSS/BLUE SHIELD | Admitting: Physician Assistant

## 2016-08-26 VITALS — BP 108/76 | HR 85 | Temp 98.4°F | Resp 16 | Ht 69.75 in | Wt 168.4 lb

## 2016-08-26 DIAGNOSIS — J309 Allergic rhinitis, unspecified: Secondary | ICD-10-CM | POA: Diagnosis not present

## 2016-08-26 DIAGNOSIS — L03211 Cellulitis of face: Secondary | ICD-10-CM

## 2016-08-26 MED ORDER — MONTELUKAST SODIUM 10 MG PO TABS: 10.0000 mg | ORAL_TABLET | Freq: Every day | ORAL | 2 refills | Status: DC

## 2016-08-26 MED ORDER — FLUTICASONE PROPIONATE 50 MCG/ACT NA SUSP: NASAL | 2 refills | Status: DC

## 2016-08-26 MED ORDER — CETIRIZINE HCL 10 MG PO TABS: 10.0000 mg | ORAL_TABLET | Freq: Every day | ORAL | 11 refills | Status: DC

## 2016-08-26 MED ORDER — MINOCYCLINE HCL 100 MG PO CAPS: 100.0000 mg | ORAL_CAPSULE | Freq: Two times a day (BID) | ORAL | 0 refills | Status: DC

## 2016-08-26 MED ORDER — IPRATROPIUM BROMIDE 0.03 % NA SOLN: 2.0000 | Freq: Two times a day (BID) | NASAL | 1 refills | Status: DC

## 2016-08-26 MED ORDER — MUPIROCIN 2 % EX OINT: 1.0000 "application " | TOPICAL_OINTMENT | Freq: Three times a day (TID) | CUTANEOUS | 1 refills | Status: DC

## 2016-08-26 NOTE — Patient Instructions (Addendum)
  We are going to try to treat your sinuses and potential asthma with added zyrtec.  I would like you to try the atrovent nasal spray at this time.  Please let me know if the atrovent nasal spray works, then I can refill it for a year.  This will hopefully help your ability to not have to use the nasal spray (by using the zyrtec)   IF you received an x-ray today, you will receive an invoice from [facility]. Please contact [facility] at [phone] with questions or concerns regarding your invoice.   IF you received labwork today, you will receive an invoice from [facility]. Please contact [facility] at [phone] with questions or concerns regarding your invoice.   Our billing staff will not be able to assist you with questions regarding bills from these companies.  You will be contacted with the lab results as soon as they are available. The fastest way to get your results is to activate your My Chart account. Instructions are located on the last page of this paperwork. If you have not heard from [place] regarding the results in 2 weeks, please contact this office.

## 2016-08-26 NOTE — Progress Notes (Signed)
[facility] [address], [place] [address] [phone]  Date:  08/26/2016   Name:  Lauren Gibbs   DOB:  09-Dec-1980   MRN:  [id number]  PCP:  No primary care provider on file.   Chief Complaint  Patient presents with  . Medication Refill    Singular 10 mg, Flonase    History of Present Illness:  Lauren Gibbs is a 36 y.o. female patient who presents to [facility] for cc of medicaiton refill of the singulair and flonase.  Patient states that she does well with the singulair in controlling allergies and asthma.  She uses the flonase daily for congestion.  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  Given minocycline at 50mg  daily which helps the rash that started in her nose and spread around her nostrils and near mouth.  It is a burning rash in character.  She does not take a lot due to a rash that starts on her chest without symptoms.  No oral swelling, or respiratory issues with taking the medication.  She has no 2nd gen anti-histamine on board.     Patient Active Problem List   Diagnosis Date Noted  . 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 Allergic rhinitis 03/02/2012  . ADD (attention deficit disorder) 03/02/2012    Past Medical History:  Diagnosis Date  . Allergy   . Asthma     Past Surgical History:  Procedure Laterality Date  . TONSILLECTOMY    . TYMPANOSTOMY TUBE PLACEMENT      Social History  Substance Use Topics  . Smoking status: Never Smoker  . Smokeless tobacco: Never Used  . Alcohol use Yes    Family History  Problem Relation Age of Onset  . Heart disease Maternal Grandfather   . Cancer Paternal Grandmother   . Cancer Paternal Grandfather     Allergies  Allergen Reactions  . Amoxicillin Rash  . Ceclor [Cefaclor] Rash  . Keflex [Cephalexin] Rash  . Sulfa Antibiotics Rash    Medication list has been reviewed and updated.  Current Outpatient Prescriptions on File Prior to Visit   Medication Sig Dispense Refill  . fluticasone (FLONASE) 50 MCG/ACT nasal spray instill 1 spray into each nostril once daily 16 g 2  . montelukast (SINGULAIR) 10 MG tablet take 1 tablet by mouth at bedtime 90 tablet 2  . albuterol (PROVENTIL HFA;VENTOLIN HFA) 108 (90 BASE) MCG/ACT inhaler Inhale 2 puffs into the lungs every 6 (six) hours as needed for wheezing or shortness of breath. 1 Inhaler 5  . amphetamine-dextroamphetamine (ADDERALL XR) 20 MG 24 hr capsule Take 1 capsule (20 mg total) by mouth every morning. (Patient not taking: Reported on 08/14/2015) 30 capsule 0  . amphetamine-dextroamphetamine (ADDERALL XR) 20 MG 24 hr capsule Take 1 capsule (20 mg total) by mouth every morning. (Patient not taking: Reported on 08/14/2015) 30 capsule 0  . amphetamine-dextroamphetamine (ADDERALL XR) 20 MG 24 hr capsule Take 1 capsule (20 mg total) by mouth every morning. (Patient not taking: Reported on 08/14/2015) 30 capsule 0   No current facility-administered medications on file prior to visit.     ROS ROS otherwise unremarkable unless listed above.   Physical Examination: BP 108/76   Pulse 85   Temp 98.4 F (36.9 C) (Oral)   Resp 16   Ht 5' 9.75" (1.772 m)   Wt 168 lb 6.4 oz (76.4 kg)   LMP 07/21/2016  SpO2 99%   BMI 24.34 kg/m  Ideal Body Weight: Weight in (lb) to have BMI = 25: 172.6  Physical Exam  Constitutional: She is oriented to person, place, and time. She appears well-developed and well-nourished. No distress.  HENT:  Head: Normocephalic and atraumatic.  Right Ear: External ear normal.  Left Ear: External ear normal.  Mild erythema and papules/pustule that just in the nostrils, very minimal peaking from inside the nostrils.  Eyes: Conjunctivae and EOM are normal. Pupils are equal, round, and reactive to light.  Cardiovascular: Normal rate.   Pulmonary/Chest: Effort normal. No respiratory distress.  Neurological: She is alert and oriented to person, place, and time.  Skin: She  is not diaphoretic.  Psychiatric: She has a normal mood and affect. Her behavior is normal.     Assessment and Plan: Lauren Gibbs is a 36 y.o. female who is here today for medication refill We will treat with minocycline and given mupirocin at this time.   Also advised that we will start zyrtec to see if this may help avoid using the nasal spray to only as needed.  If she continues to have the congestion, she will use the ipratropium instead of the steroid to see if this is the culprit of infection.  If she must use the flonase she is advised to rinse with water, and gently blot with cloth to dry. Chronic allergic rhinitis, unspecified seasonality, unspecified trigger - Plan: cetirizine (ZYRTEC) 10 MG tablet, montelukast (SINGULAIR) 10 MG tablet, ipratropium (ATROVENT) 0.03 % nasal spray, minocycline (MINOCIN) 100 MG capsule, fluticasone (FLONASE) 50 MCG/ACT nasal spray  Cellulitis of face - Plan: minocycline (MINOCIN) 100 MG capsule, mupirocin ointment (BACTROBAN) 2 %  Ivar Drape, PA-C [facility] 1/21/20188:20 AM

## 2016-09-26 DIAGNOSIS — Z01419 Encounter for gynecological examination (general) (routine) without abnormal findings: Secondary | ICD-10-CM | POA: Diagnosis not present

## 2016-09-26 DIAGNOSIS — Z6824 Body mass index (BMI) 24.0-24.9, adult: Secondary | ICD-10-CM | POA: Diagnosis not present

## 2017-01-04 DIAGNOSIS — H5213 Myopia, bilateral: Secondary | ICD-10-CM | POA: Diagnosis not present

## 2017-01-10 DIAGNOSIS — D239 Other benign neoplasm of skin, unspecified: Secondary | ICD-10-CM | POA: Diagnosis not present

## 2017-01-10 DIAGNOSIS — L821 Other seborrheic keratosis: Secondary | ICD-10-CM | POA: Diagnosis not present

## 2017-05-15 DIAGNOSIS — D225 Melanocytic nevi of trunk: Secondary | ICD-10-CM | POA: Diagnosis not present

## 2017-05-15 DIAGNOSIS — Z23 Encounter for immunization: Secondary | ICD-10-CM | POA: Diagnosis not present

## 2017-06-01 ENCOUNTER — Telehealth: Payer: Self-pay | Admitting: Physician Assistant

## 2017-06-01 NOTE — Telephone Encounter (Signed)
Pt is calling to request a refill on her montelukast.  Pt still uses the [facility] on [address].  Please advise [phone]

## 2017-06-02 DIAGNOSIS — C4359 Malignant melanoma of other part of trunk: Secondary | ICD-10-CM | POA: Diagnosis not present

## 2017-06-03 ENCOUNTER — Other Ambulatory Visit: Payer: Self-pay | Admitting: Physician Assistant

## 2017-06-03 DIAGNOSIS — J309 Allergic rhinitis, unspecified: Secondary | ICD-10-CM

## 2017-06-05 ENCOUNTER — Other Ambulatory Visit: Payer: Self-pay

## 2017-06-05 MED ORDER — MONTELUKAST SODIUM 10 MG PO TABS: 10.0000 mg | ORAL_TABLET | Freq: Every day | ORAL | 0 refills | Status: DC

## 2017-06-05 NOTE — Telephone Encounter (Signed)
LVM. 30 day refill done. Will need OV for additional.

## 2017-06-13 DIAGNOSIS — T819XXA Unspecified complication of procedure, initial encounter: Secondary | ICD-10-CM | POA: Diagnosis not present

## 2017-06-14 ENCOUNTER — Ambulatory Visit: Payer: BLUE CROSS/BLUE SHIELD | Admitting: Physician Assistant

## 2017-06-14 ENCOUNTER — Encounter: Payer: Self-pay | Admitting: Physician Assistant

## 2017-06-14 ENCOUNTER — Ambulatory Visit (INDEPENDENT_AMBULATORY_CARE_PROVIDER_SITE_OTHER): Payer: BLUE CROSS/BLUE SHIELD

## 2017-06-14 VITALS — BP 114/84 | HR 82 | Temp 99.6°F | Resp 18 | Ht 69.75 in | Wt 176.8 lb

## 2017-06-14 DIAGNOSIS — M545 Low back pain: Secondary | ICD-10-CM

## 2017-06-14 DIAGNOSIS — S39012A Strain of muscle, fascia and tendon of lower back, initial encounter: Secondary | ICD-10-CM

## 2017-06-14 DIAGNOSIS — C439 Malignant melanoma of skin, unspecified: Secondary | ICD-10-CM | POA: Insufficient documentation

## 2017-06-14 DIAGNOSIS — M533 Sacrococcygeal disorders, not elsewhere classified: Secondary | ICD-10-CM | POA: Diagnosis not present

## 2017-06-14 MED ORDER — CYCLOBENZAPRINE HCL 10 MG PO TABS: 5.0000 mg | ORAL_TABLET | Freq: Three times a day (TID) | ORAL | 0 refills | Status: AC | PRN

## 2017-06-14 MED ORDER — MELOXICAM 15 MG PO TABS: 15.0000 mg | ORAL_TABLET | Freq: Every day | ORAL | 1 refills | Status: AC

## 2017-06-14 NOTE — Progress Notes (Signed)
PRIMARY [facility]ARE AT [place], [name] [address] [phone]  Date:  06/14/2017   Name:  Lauren Gibbs   DOB:  1980/11/07   MRN:  [id number]  PCP:  Patient, No Pcp Per    History of Present Illness:  Lauren Gibbs is a 36 y.o. female patient who presents to PCP with  Chief Complaint  Patient presents with  . Back Pain    lower back pain, x1 month, pt states she isn't having any urinary symptoms. Pt states she injuryed her back. Pt states she was running after her niece and she picked her up "without using her knees". Pt states he movements are restricted and she wants to know if she need PT.     1 month ago, attempting to pick up child in a rush, she felt a deep pain that made her nauseous.  She had to get up with her legs and arms.  She feels like her range of motion is limited due to the pain.  No numbness or tingling down the legs.  She has had some improvement of her pain.  More prominent with sitting up.   She iced the area and laid on heating pad.  Ibuprofen was also part of her regimen for relief.  She attempted yoga. 10-14 days after initial injury, which helped some.    Patient Active Problem List   Diagnosis Date Noted  . Skin cancer (melanoma) ([place])   . 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 Allergic rhinitis 03/02/2012  . ADD (attention deficit disorder) 03/02/2012    Past Medical History:  Diagnosis Date  . Allergy   . Asthma   . Skin cancer (melanoma) ([place])    On the back    Past Surgical History:  Procedure Laterality Date  . TONSILLECTOMY    . TYMPANOSTOMY TUBE PLACEMENT      Social History   Tobacco Use  . Smoking status: Never Smoker  . Smokeless tobacco: Never Used  Substance Use Topics  . Alcohol use: Yes  . Drug use: No    Family History  Problem Relation Age of Onset  . Heart disease Maternal Grandfather   . Cancer Paternal Grandmother   . Cancer Paternal Grandfather     Allergies  Allergen Reactions  . Amoxicillin Rash  .  Ceclor [Cefaclor] Rash  . Keflex [Cephalexin] Rash  . Sulfa Antibiotics Rash    Medication list has been reviewed and updated.  Current Outpatient Medications on File Prior to Visit  Medication Sig Dispense Refill  . doxycycline (VIBRA-TABS) 100 MG tablet Take 100 mg 2 (two) times daily by mouth. for 10 days  0  . montelukast (SINGULAIR) 10 MG tablet Take 1 tablet (10 mg total) by mouth at bedtime. 30 tablet 0  . albuterol (PROVENTIL HFA;VENTOLIN HFA) 108 (90 BASE) MCG/ACT inhaler Inhale 2 puffs into the lungs every 6 (six) hours as needed for wheezing or shortness of breath. 1 Inhaler 5  . amphetamine-dextroamphetamine (ADDERALL XR) 20 MG 24 hr capsule Take 1 capsule (20 mg total) by mouth every morning. (Patient not taking: Reported on 08/14/2015) 30 capsule 0  . amphetamine-dextroamphetamine (ADDERALL XR) 20 MG 24 hr capsule Take 1 capsule (20 mg total) by mouth every morning. (Patient not taking: Reported on 08/14/2015) 30 capsule 0  . amphetamine-dextroamphetamine (ADDERALL XR) 20 MG 24 hr capsule Take 1 capsule (20 mg total) by mouth every morning. (Patient not taking: Reported on 08/14/2015) 30 capsule 0  . cetirizine (ZYRTEC) 10  MG tablet Take 1 tablet (10 mg total) by mouth daily. (Patient not taking: Reported on 06/14/2017) 30 tablet 11  . fluticasone (FLONASE) 50 MCG/ACT nasal spray instill 1 spray into each nostril once daily (Patient not taking: Reported on 06/14/2017) 16 g 2  . ipratropium (ATROVENT) 0.03 % nasal spray Place 2 sprays into both nostrils 2 (two) times daily. (Patient not taking: Reported on 06/14/2017) 30 mL 1  . minocycline (MINOCIN) 100 MG capsule Take 1 capsule (100 mg total) by mouth 2 (two) times daily. Start with 2 tablets the first day. (Patient not taking: Reported on 06/14/2017) 10 capsule 0  . mupirocin ointment (BACTROBAN) 2 % Apply 1 application topically 3 (three) times daily. (Patient not taking: Reported on 06/14/2017) 22 g 1   No current facility-administered  medications on file prior to visit.     ROS ROS otherwise unremarkable unless listed above.  Physical Examination: BP 114/84 (BP Location: Right Arm, Patient Position: Sitting, Cuff Size: Normal)   Pulse 82   Temp 99.6 F (37.6 C) (Oral)   Resp 18   Ht 5' 9.75" (1.772 m)   Wt 176 lb 12.8 oz (80.2 kg)   LMP 05/14/2017   SpO2 100%   BMI 25.55 kg/m  Ideal Body Weight: Weight in (lb) to have BMI = 25: 172.6  Physical Exam  Constitutional: She is oriented to person, place, and time. She appears well-developed and well-nourished. No distress.  HENT:  Head: Normocephalic and atraumatic.  Right Ear: External ear normal.  Left Ear: External ear normal.  Eyes: Conjunctivae and EOM are normal. Pupils are equal, round, and reactive to light.  Cardiovascular: Normal rate.  Pulmonary/Chest: Effort normal. No respiratory distress.  Musculoskeletal:       Lumbar back: She exhibits decreased range of motion (120 degrees.). She exhibits no tenderness, no swelling and no spasm.  Pain at right side with lateral deviation to the left and right.  Normal rom with this.  Pain incited again with torso rotation.  Neurological: She is alert and oriented to person, place, and time.  Skin: She is not diaphoretic.  Psychiatric: She has a normal mood and affect. Her behavior is normal.   Dg Lumbar Spine Complete  Result Date: 06/14/2017 CLINICAL DATA:  Low back pain for 1 month following lifting child, initial encounter EXAM: LUMBAR SPINE - COMPLETE 4+ VIEW COMPARISON:  None. FINDINGS: Five lumbar type vertebral bodies are well visualized. Vertebral body height is well maintained. No pars defects are seen. No anterolisthesis is noted. Very mild disc space narrowing is noted at L5-S1. No soft tissue changes are seen. IMPRESSION: Mild degenerative change without acute abnormality. Electronically Signed   By: Inez Catalina M.D.   On: 06/14/2017 16:05   Dg Sacrum/coccyx  Result Date: 06/14/2017 CLINICAL DATA:   Sacral pain for over 1 month.  No known injury. EXAM: SACRUM AND COCCYX - 2+ VIEW COMPARISON:  None. FINDINGS: There is no evidence of fracture or other focal bone lesions. IMPRESSION: Normal exam. Electronically Signed   By: Inge Rise M.D.   On: 06/14/2017 16:05     Assessment and Plan: ALEXCIA SCHOOLS is a 36 y.o. female who is here today for cc of  Chief Complaint  Patient presents with  . Back Pain    lower back pain, x1 month, pt states she isn't having any urinary symptoms. Pt states she injuryed her back. Pt states she was running after her niece and she picked her up "without using  her knees". Pt states he movements are restricted and she wants to know if she need PT.   NSAID, msk relaxant and icing.  Will refer to physical therapy at this time.  She is attempting to do and perform her stretches at home, however not to complete success.  We will attempt more with the physical therapist.   Alarming symptoms discussed to warrant an immediate return.   Acute midline low back pain, with sciatica presence unspecified - Plan: DG Sacrum/Coccyx, DG Lumbar Spine Complete, Ambulatory referral to Physical Therapy  Strain of lumbar region, initial encounter - Plan: meloxicam (MOBIC) 15 MG tablet, cyclobenzaprine (FLEXERIL) 10 MG tablet  Ivar Drape, PA-C [facility] 11/7/20186:41 PM

## 2017-06-14 NOTE — Patient Instructions (Addendum)
Please use the mobic.  Do not use ibuprofen or naproxen with this medication Please note that the muscle relaxant can cause sedation.  Please do not operate heavy machinery. I am referring you to physical therapy, however if your symptoms worsen, you should return here.  Please await contact for this.    IF you received an x-ray today, you will receive an invoice from [facility]. Please contact [facility] at [phone] with questions or concerns regarding your invoice.   IF you received labwork today, you will receive an invoice from [facility]. Please contact [facility] at [phone] with questions or concerns regarding your invoice.   Our billing staff will not be able to assist you with questions regarding bills from these companies.  You will be contacted with the lab results as soon as they are available. The fastest way to get your results is to activate your My Chart account. Instructions are located on the last page of this paperwork. If you have not heard from [place] regarding the results in 2 weeks, please contact this office.

## 2017-07-03 ENCOUNTER — Other Ambulatory Visit: Payer: Self-pay | Admitting: Physician Assistant

## 2017-07-21 ENCOUNTER — Telehealth: Payer: Self-pay | Admitting: Physician Assistant

## 2017-07-21 NOTE — Telephone Encounter (Signed)
Copied from [place]. Topic: Quick Communication - See Telephone Encounter >> Jul 21, 2017 12:02 PM Ether Griffins B wrote: CRM for notification. See Telephone encounter for:  Pt was seen not even a month ago and would like a refill on Singulair. Pt has 10 days worth left  07/21/17.

## 2017-07-25 ENCOUNTER — Other Ambulatory Visit: Payer: Self-pay | Admitting: *Deleted

## 2017-07-25 MED ORDER — MONTELUKAST SODIUM 10 MG PO TABS: 10.0000 mg | ORAL_TABLET | Freq: Every day | ORAL | 4 refills | Status: AC

## 2017-08-28 DIAGNOSIS — D225 Melanocytic nevi of trunk: Secondary | ICD-10-CM | POA: Diagnosis not present

## 2017-08-28 DIAGNOSIS — Z85820 Personal history of malignant melanoma of skin: Secondary | ICD-10-CM | POA: Diagnosis not present

## 2017-08-28 DIAGNOSIS — D2262 Melanocytic nevi of left upper limb, including shoulder: Secondary | ICD-10-CM | POA: Diagnosis not present

## 2017-08-28 DIAGNOSIS — D485 Neoplasm of uncertain behavior of skin: Secondary | ICD-10-CM | POA: Diagnosis not present

## 2017-09-11 DIAGNOSIS — J4520 Mild intermittent asthma, uncomplicated: Secondary | ICD-10-CM | POA: Diagnosis not present

## 2017-09-27 DIAGNOSIS — Z01419 Encounter for gynecological examination (general) (routine) without abnormal findings: Secondary | ICD-10-CM | POA: Diagnosis not present

## 2017-09-27 DIAGNOSIS — Z6824 Body mass index (BMI) 24.0-24.9, adult: Secondary | ICD-10-CM | POA: Diagnosis not present

## 2017-10-02 DIAGNOSIS — L700 Acne vulgaris: Secondary | ICD-10-CM | POA: Diagnosis not present

## 2017-10-24 ENCOUNTER — Ambulatory Visit: Payer: Self-pay | Admitting: Surgery

## 2017-10-24 DIAGNOSIS — K641 Second degree hemorrhoids: Secondary | ICD-10-CM | POA: Diagnosis not present

## 2017-10-24 DIAGNOSIS — Z01818 Encounter for other preprocedural examination: Secondary | ICD-10-CM | POA: Diagnosis not present

## 2017-10-24 DIAGNOSIS — K643 Fourth degree hemorrhoids: Secondary | ICD-10-CM | POA: Diagnosis not present

## 2017-10-26 ENCOUNTER — Ambulatory Visit: Payer: Self-pay | Admitting: Surgery

## 2017-10-26 NOTE — H&P (Signed)
Karl Pock Documented: 10/24/2017 11:09 AM Location: [facility] Patient #: [id number] DOB: 09-Jul-1981 Single / Language: Cleophus Molt / Race: White Female  History of Present Illness Adin Hector MD; 10/26/2017 4:59 PM) The patient is a 37 year old female who presents with hemorrhoids. Note for "Hemorrhoids": ` ` ` Patient sent for surgical consultation at the request of Dr. Linda Hedges  Chief Complaint: Worsening hemorrhoids  The patient is a pleasant woman that struggled with irregular bowels for most of her life. She recalls being rather constipated as a toddler. Struggle with intermittent diarrhea. Sometimes he explosive. Worried she had IBS. She's adjusted her diet and has focused more on a gluten-free diet. Her bowels are moving more regularly now. However she's noticed hemorrhoid issues in the past. Never any intervention with a. Some discomfort and bleeding. She feel something out all the time. She was discussing with some friends in [place]. I had discussed that perhaps all she needed was banding. She discussed with her OB/GYN. Surgical consultation requested.  She does not smoke. She used to in the past. She walk several hours without difficulty. She's never had a colonoscopy. More irritating than anything else. No major soiling. No personal nor family history of GI/colon cancer, inflammatory bowel disease, irritable bowel syndrome, allergy such as Celiac Sprue, dietary/dairy problems, colitis, ulcers nor gastritis. No recent sick contacts/gastroenteritis. No travel outside the country. No changes in diet. No dysphagia to solids or liquids. No significant heartburn or reflux. No hematochezia, hematemesis, coffee ground emesis. No evidence of prior gastric/peptic ulceration.  (Review of systems as stated in this history (HPI) or in the review of systems. Otherwise all other 12 point ROS are negative) ` ` `   Past Surgical History Sharyn Lull  R. Rolena Infante, CMA; 10/24/2017 11:12 AM) Oral Surgery Tonsillectomy  Diagnostic Studies History Sharyn Lull R. Brooks, CMA; 10/24/2017 11:12 AM) Colonoscopy never Mammogram never Pap Smear 1-5 years ago  Allergies Sharyn Lull R. Brooks, CMA; 10/24/2017 11:12 AM) Sulfa 10 *OPHTHALMIC AGENTS* Ceclor *CEPHALOSPORINS*  Medication History (Michelle R. Brooks, CMA; 10/24/2017 11:12 AM) Montelukast Sodium (10MG  Tablet, Oral) Active. Medications Reconciled  Social History Sharyn Lull R. Brooks, CMA; 10/24/2017 11:12 AM) Alcohol use Occasional alcohol use. Caffeine use Coffee, Tea. Illicit drug use Uses socially only. Tobacco use Former smoker.  Family History Sharyn Lull R. Rolena Infante, CMA; 10/24/2017 11:12 AM) Arthritis Mother. Cancer Father. Heart disease in female family member before age 25 Melanoma Mother, Sister. Migraine Headache Mother.  Pregnancy / Birth History Sharyn Lull R. Rolena Infante, CMA; 10/24/2017 11:12 AM) Age at menarche 88 years. Contraceptive History Oral contraceptives. Gravida 0 Irregular periods Para 0  Other Problems (Michelle R. Brooks, CMA; 10/24/2017 11:12 AM) Anxiety Disorder Asthma Back Pain Hemorrhoids Hypercholesterolemia Melanoma     Review of Systems [facility] R. Brooks CMA; 10/24/2017 11:12 AM) General Not Present- Appetite Loss, Chills, Fatigue, Fever, Night Sweats, Weight Gain and Weight Loss. Skin Not Present- Change in Wart/Mole, Dryness, Hives, Jaundice, New Lesions, Non-Healing Wounds, Rash and Ulcer. HEENT Present- Seasonal Allergies and Wears glasses/contact lenses. Not Present- Earache, Hearing Loss, Hoarseness, Nose Bleed, Oral Ulcers, Ringing in the Ears, Sinus Pain, Sore Throat, Visual Disturbances and Yellow Eyes. Respiratory Present- Chronic Cough. Not Present- Bloody sputum, Difficulty Breathing, Snoring and Wheezing. Breast Not Present- Breast Mass, Breast Pain, Nipple Discharge and Skin Changes. Cardiovascular Not Present-  Chest Pain, Difficulty Breathing Lying Down, Leg Cramps, Palpitations, Rapid Heart Rate, Shortness of Breath and Swelling of Extremities. Gastrointestinal Present- Hemorrhoids. Not Present- Abdominal Pain, Bloating, Bloody Stool, Change  in Bowel Habits, Chronic diarrhea, Constipation, Difficulty Swallowing, Excessive gas, Gets full quickly at meals, Indigestion, Nausea, Rectal Pain and Vomiting. Female Genitourinary Not Present- Frequency, Nocturia, Painful Urination, Pelvic Pain and Urgency. Musculoskeletal Not Present- Back Pain, Joint Pain, Joint Stiffness, Muscle Pain, Muscle Weakness and Swelling of Extremities. Neurological Not Present- Decreased Memory, Fainting, Headaches, Numbness, Seizures, Tingling, Tremor, Trouble walking and Weakness. Psychiatric Present- Anxiety. Not Present- Bipolar, Change in Sleep Pattern, Depression, Fearful and Frequent crying. Endocrine Not Present- Cold Intolerance, Excessive Hunger, Hair Changes, Heat Intolerance, Hot flashes and New Diabetes. Hematology Not Present- Blood Thinners, Easy Bruising, Excessive bleeding, Gland problems, HIV and Persistent Infections.  Vitals [employer] R. Brooks CMA; 10/24/2017 11:12 AM) 10/24/2017 11:11 AM Weight: 173.38 lb Height: 71in Body Surface Area: 1.98 m Body Mass Index: 24.18 kg/m  Pulse: 60 (Regular)  BP: 124/76 (Sitting, Left Arm, Standard)      Physical Exam Adin Hector MD; 10/26/2017 4:59 PM)  General Mental Status-Alert. General Appearance-Not in acute distress, Not Sickly. Orientation-Oriented X3. Hydration-Well hydrated. Voice-Normal.  Integumentary Global Assessment Upon inspection and palpation of skin surfaces of the - Axillae: non-tender, no inflammation or ulceration, no drainage. and Distribution of scalp and body hair is normal. General Characteristics Temperature - normal warmth is noted.  Head and Neck Head-normocephalic, atraumatic with no lesions or palpable  masses. Face Global Assessment - atraumatic, no absence of expression. Neck Global Assessment - no abnormal movements, no bruit auscultated on the right, no bruit auscultated on the left, no decreased range of motion, non-tender. Trachea-midline. Thyroid Gland Characteristics - non-tender.  Eye Eyeball - Left-Extraocular movements intact, No Nystagmus. Eyeball - Right-Extraocular movements intact, No Nystagmus. Cornea - Left-No Hazy. Cornea - Right-No Hazy. Sclera/Conjunctiva - Left-No scleral icterus, No Discharge. Sclera/Conjunctiva - Right-No scleral icterus, No Discharge. Pupil - Left-Direct reaction to light normal. Pupil - Right-Direct reaction to light normal.  ENMT Ears Pinna - Left - no drainage observed, no generalized tenderness observed. Right - no drainage observed, no generalized tenderness observed. Nose and Sinuses External Inspection of the Nose - no destructive lesion observed. Inspection of the nares - Left - quiet respiration. Right - quiet respiration. Mouth and Throat Lips - Upper Lip - no fissures observed, no pallor noted. Lower Lip - no fissures observed, no pallor noted. Nasopharynx - no discharge present. Oral Cavity/Oropharynx - Tongue - no dryness observed. Oral Mucosa - no cyanosis observed. Hypopharynx - no evidence of airway distress observed.  Chest and Lung Exam Inspection Movements - Normal and Symmetrical. Accessory muscles - No use of accessory muscles in breathing. Palpation Palpation of the chest reveals - Non-tender. Auscultation Breath sounds - Normal and Clear.  Cardiovascular Auscultation Rhythm - Regular. Murmurs & Other Heart Sounds - Auscultation of the heart reveals - No Murmurs and No Systolic Clicks.  Abdomen Inspection Inspection of the abdomen reveals - No Visible peristalsis and No Abnormal pulsations. Umbilicus - No Bleeding, No Urine drainage. Palpation/Percussion Palpation and Percussion of the abdomen  reveal - Soft, Non Tender, No Rebound tenderness, No Rigidity (guarding) and No Cutaneous hyperesthesia. Note: Abdomen soft. Not severely distended. No distasis recti. No umbilical or other anterior abdominal wall hernias  Female Genitourinary Sexual Maturity Tanner 5 - Adult hair pattern. Note: No vaginal bleeding nor discharge  Rectal Note: ` ` ` Please refer to anoscopy section. Moderate size chronically prolapsed right posterior hemorrhoid with external component as well.  Peripheral Vascular Upper Extremity Inspection - Left - No Cyanotic nailbeds, Not Ischemic. Right -  No Cyanotic nailbeds, Not Ischemic.  Neurologic Neurologic evaluation reveals -normal attention span and ability to concentrate, able to name objects and repeat phrases. Appropriate fund of knowledge , normal sensation and normal coordination. Mental Status Affect - not angry, not paranoid. Cranial Nerves-Normal Bilaterally. Gait-Normal.  Neuropsychiatric Mental status exam performed with findings of-able to articulate well with normal speech/language, rate, volume and coherence, thought content normal with ability to perform basic computations and apply abstract reasoning and no evidence of hallucinations, delusions, obsessions or homicidal/suicidal ideation.  Musculoskeletal Global Assessment Spine, Ribs and Pelvis - no instability, subluxation or laxity. Right Upper Extremity - no instability, subluxation or laxity.  Lymphatic Head & Neck  General Head & Neck Lymphatics: Bilateral - Description - No Localized lymphadenopathy. Axillary  General Axillary Region: Bilateral - Description - No Localized lymphadenopathy. Femoral & Inguinal  Generalized Femoral & Inguinal Lymphatics: Left - Description - No Localized lymphadenopathy. Right - Description - No Localized lymphadenopathy.   Results Adin Hector MD; 10/26/2017 5:01  PM) Procedures  Name Value Date Hemorrhoids Procedure Anal exam: External Hemorrhoid prolapse Internal exam: Internal Hemorroids ( non-bleeding) prolapse Other: Moderate size right posterior hemorrhoid chronically prolapsed out turning into an external hemorrhoid. Right anterior grade 2. Left lateral grade 1............Marland KitchenPerianal skin clean with good hygiene. No pruritis ani. No pilonidal disease. No fissure. No abscess/fistula. Normal sphincter tone. ....Marland KitchenMarland KitchenNo condyloma warts. Tolerates digital and anoscopic rectal exam. No rectal masses. Exam done with assistance of female Medical Assistant in the room.  Performed: 10/24/2017 11:22 AM    Assessment & Plan Adin Hector MD; 10/26/2017 5:00 PM)  PROLAPSED INTERNAL HEMORRHOIDS, GRADE 4 (K64.3) Impression: Obviously prolapsed hemorrhoid with some internal grade 2 hemorrhoids as well. This is too far along to manage with banding only.  I think the only way to correct the disease is to do surgery. Internal hemorrhoidal ligation and pexy. Hemorrhoidectomy of remaining prolapsed tissue. Remove the obvious prolapsed hemorrhoid with external component as well.  Controlling her diarrhea will help her minimize any new hemorrhoid problems. Sounds like her IBS gluten-free diet seems to work for her. Continue that.  Current Plans ANOSCOPY, DIAGNOSTIC (78588) Pt Education - Pamphlet Given - The Hemorrhoid Book: discussed with patient and provided information. The anatomy & physiology of the anorectal region was discussed. The pathophysiology of hemorrhoids and differential diagnosis was discussed. Natural history risks without surgery was discussed. I stressed the importance of a bowel regimen to have daily soft bowel movements to minimize progression of disease. Interventions such as sclerotherapy & banding were discussed.  The patient's symptoms are not adequately controlled by medicines and other non-operative  treatments. I feel the risks & problems of no surgery outweigh the operative risks; therefore, I recommended surgery to treat the hemorrhoids by ligation, pexy, and possible resection.  Risks such as bleeding, infection, urinary difficulties, need for further treatment, heart attack, death, and other risks were discussed. I noted a good likelihood this will help address the problem. Goals of post-operative recovery were discussed as well. Possibility that this will not correct all symptoms was explained. Post-operative pain, bleeding, constipation, and other problems after surgery were discussed. We will work to minimize complications. Educational handouts further explaining the pathology, treatment options, and bowel regimen were given as well. Questions were answered. The patient expresses understanding & wishes to proceed with surgery.  Pt Education - CCS Hemorrhoids (Kable Haywood): discussed with patient and provided information.  PROLAPSED INTERNAL HEMORRHOIDS, GRADE 2 (K64.1) Impression: The anatomy & physiology of the  anorectal region was discussed. The pathophysiology of hemorrhoids and differential diagnosis was discussed. Natural history progression was discussed. I stressed the importance of a bowel regimen to have daily soft bowel movements to minimize progression of disease. Goal of one BM / day ideal. Use of wet wipes, warm baths, avoiding straining, etc were emphasized.  Educational handouts further explaining the pathology, treatment options, and bowel regimen were given as well. The patient expressed understanding.   ENCOUNTER FOR PREOPERATIVE EXAMINATION FOR GENERAL SURGICAL PROCEDURE (Z01.818)  Current Plans You are being scheduled for surgery- Our schedulers will call you.  You should hear from our office's scheduling department within 5 working days about the location, date, and time of surgery. We try to make accommodations for patient's preferences in scheduling  surgery, but sometimes the OR schedule or the surgeon's schedule prevents [place] from making those accommodations.  If you have not heard from our office [phone]) in 5 working days, call the office and ask for your surgeon's nurse.  If you have other questions about your diagnosis, plan, or surgery, call the office and ask for your surgeon's nurse.  Pt Education - CCS Rectal Prep for Anorectal outpatient/office surgery: discussed with patient and provided information. Pt Education - CCS Rectal Surgery HCI (Misaki Sozio): discussed with patient and provided information.  Adin Hector, M.D., F.A.C.S. Gastrointestinal and Minimally Invasive Surgery [facility] [address] [place], [place] [address] [phone] Main / Paging

## 2017-11-06 DIAGNOSIS — Z23 Encounter for immunization: Secondary | ICD-10-CM | POA: Diagnosis not present

## 2017-11-06 DIAGNOSIS — N926 Irregular menstruation, unspecified: Secondary | ICD-10-CM | POA: Diagnosis not present

## 2017-11-06 DIAGNOSIS — Z0000 Encounter for general adult medical examination without abnormal findings: Secondary | ICD-10-CM | POA: Diagnosis not present

## 2017-11-06 DIAGNOSIS — J4520 Mild intermittent asthma, uncomplicated: Secondary | ICD-10-CM | POA: Diagnosis not present

## 2017-11-08 ENCOUNTER — Encounter: Payer: Self-pay | Admitting: Physician Assistant

## 2017-12-04 DIAGNOSIS — D485 Neoplasm of uncertain behavior of skin: Secondary | ICD-10-CM | POA: Diagnosis not present

## 2018-05-05 DIAGNOSIS — M79671 Pain in right foot: Secondary | ICD-10-CM | POA: Diagnosis not present

## 2018-05-07 DIAGNOSIS — Z808 Family history of malignant neoplasm of other organs or systems: Secondary | ICD-10-CM | POA: Diagnosis not present

## 2018-05-07 DIAGNOSIS — D225 Melanocytic nevi of trunk: Secondary | ICD-10-CM | POA: Diagnosis not present

## 2018-05-07 DIAGNOSIS — Z85820 Personal history of malignant melanoma of skin: Secondary | ICD-10-CM | POA: Diagnosis not present

## 2018-05-07 DIAGNOSIS — Z23 Encounter for immunization: Secondary | ICD-10-CM | POA: Diagnosis not present

## 2018-05-07 DIAGNOSIS — D2262 Melanocytic nevi of left upper limb, including shoulder: Secondary | ICD-10-CM | POA: Diagnosis not present

## 2018-07-25 DIAGNOSIS — S161XXA Strain of muscle, fascia and tendon at neck level, initial encounter: Secondary | ICD-10-CM | POA: Diagnosis not present

## 2018-08-17 IMAGING — DX DG SACRUM/COCCYX 2+V
3 series · 3 of 3 positions shown · non-contrast
Comparison: None.

CLINICAL DATA: Sacral pain for over 1 month.  No known injury.

EXAM:
SACRUM AND COCCYX - 2+ VIEW

[coccyx ap]
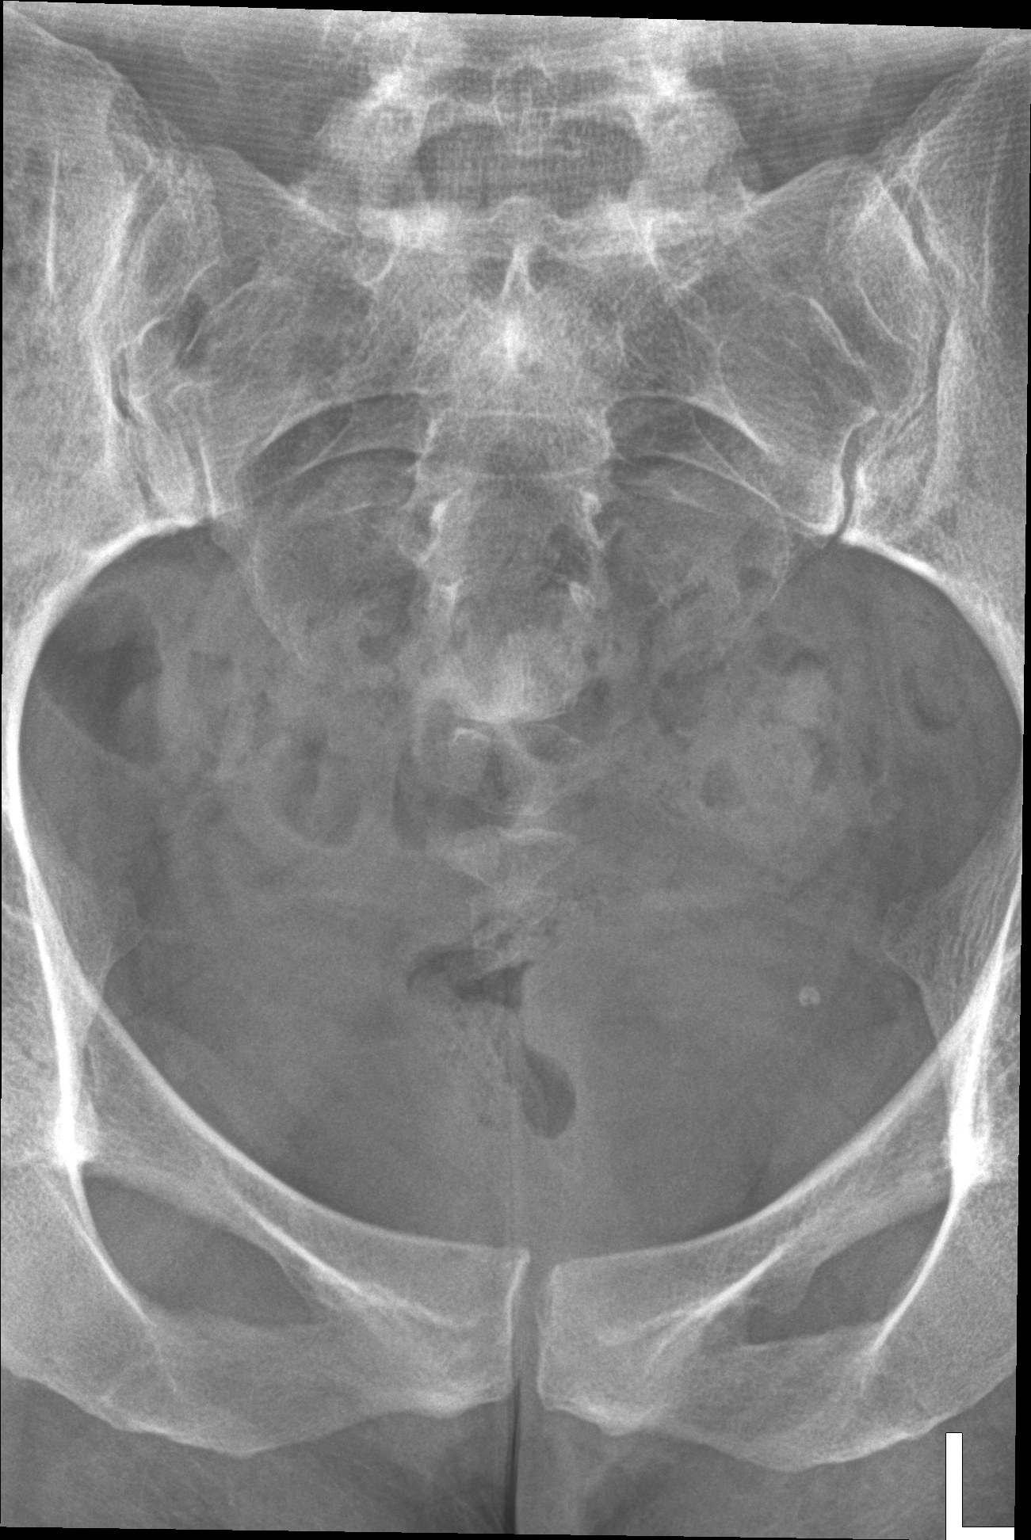

[sacrum ap]
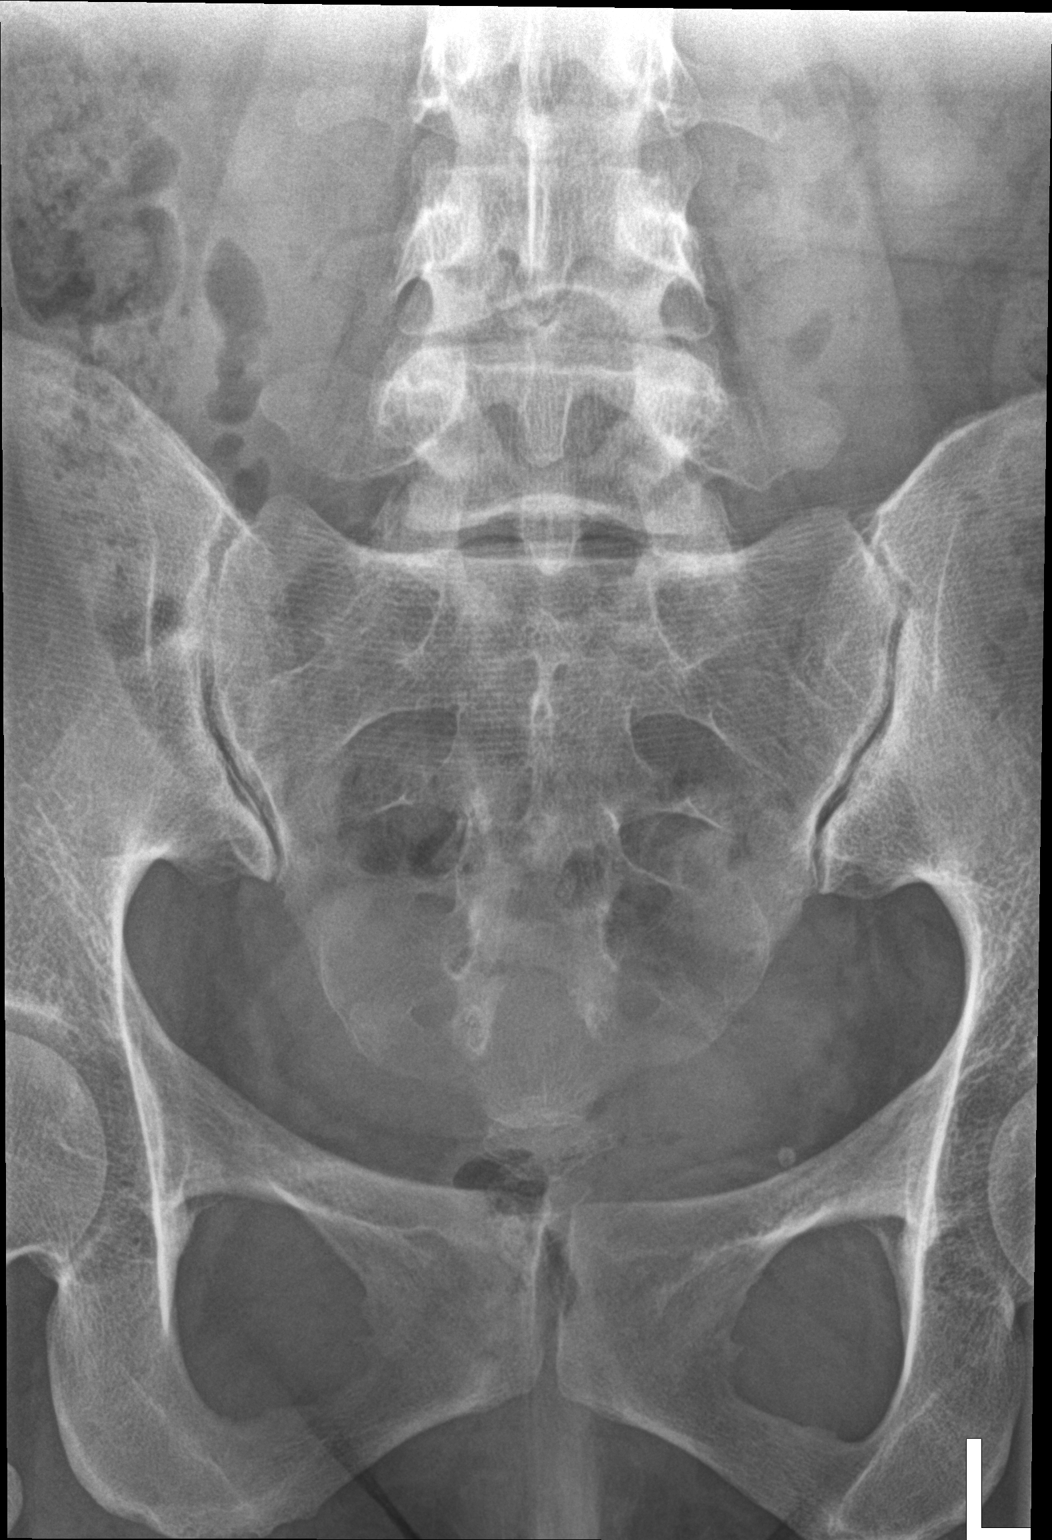

[sacrum lat]
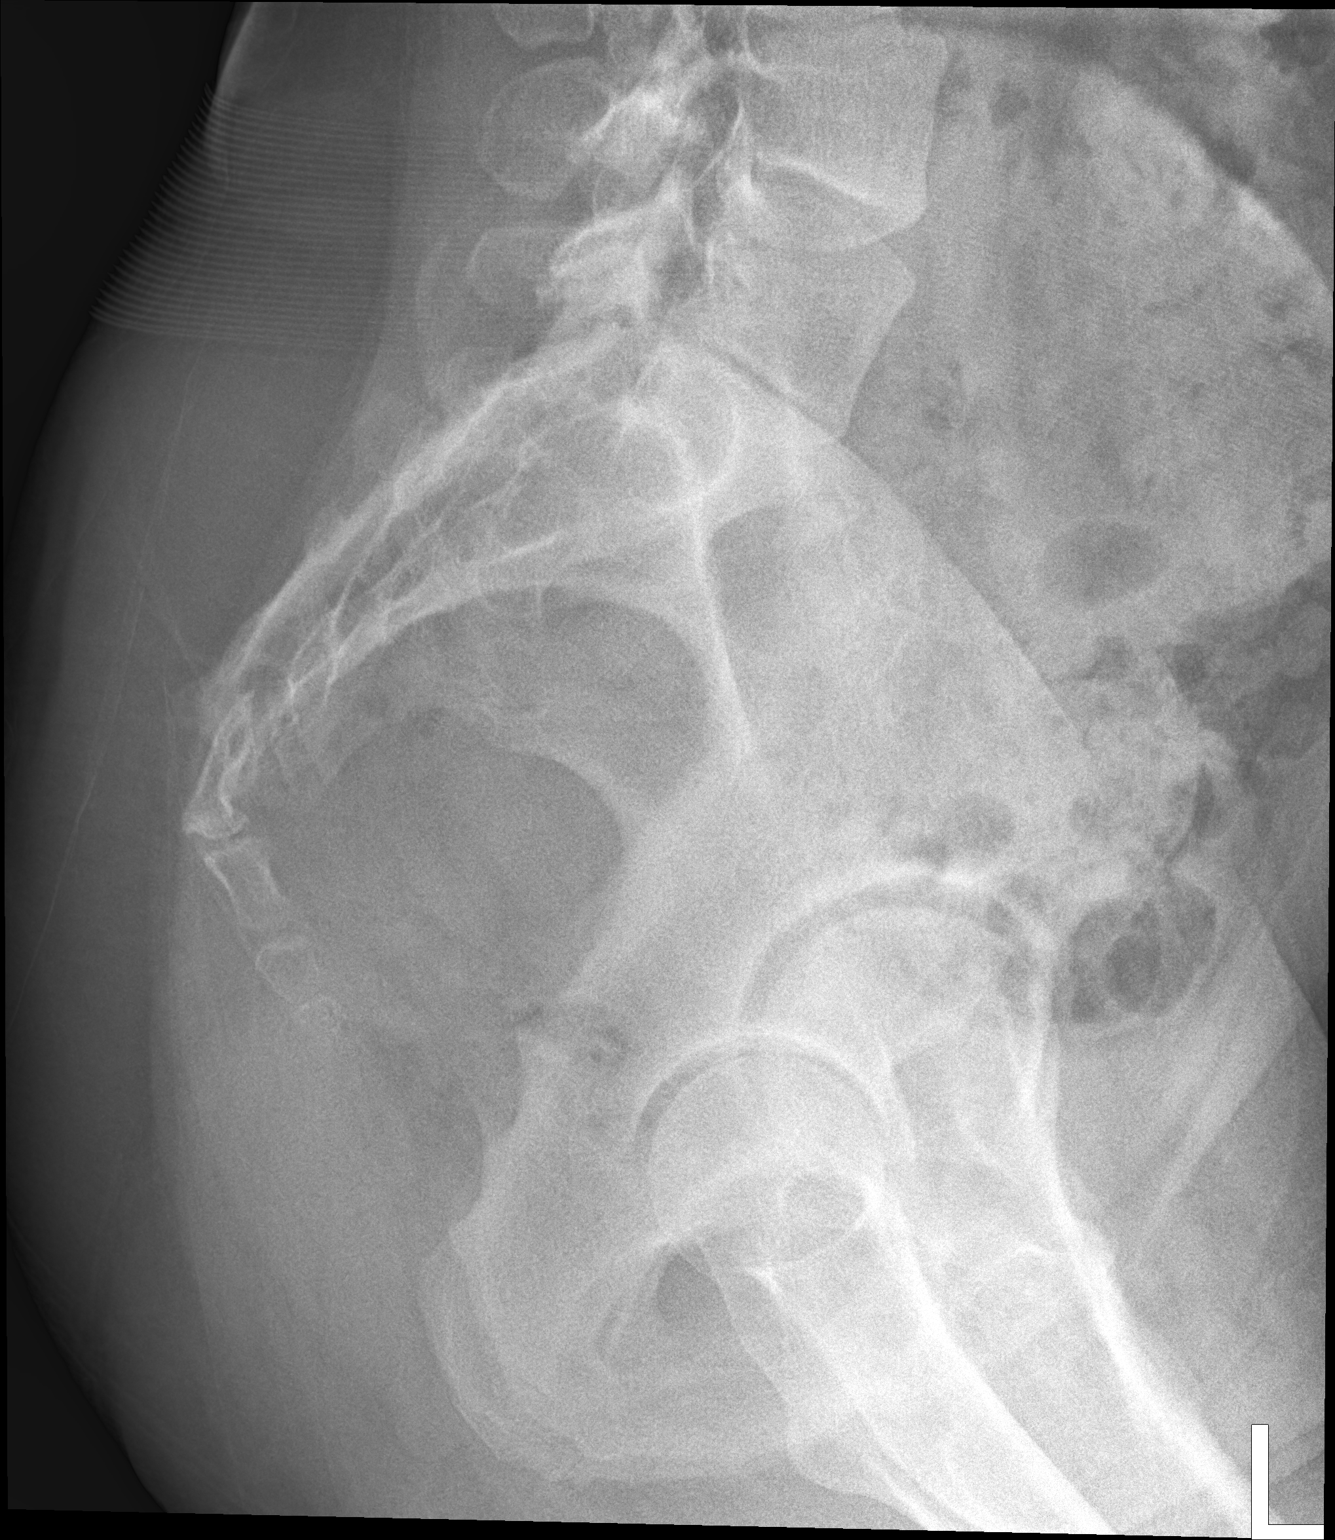

[3 of 3 positions shown; findings below may reference images not displayed]

FINDINGS: There is no evidence of fracture or other focal bone lesions.
IMPRESSION: Normal exam.

## 2018-09-03 ENCOUNTER — Ambulatory Visit
Admission: RE | Admit: 2018-09-03 | Discharge: 2018-09-03 | Disposition: A | Discharge status: Home / Self Care | Payer: BLUE CROSS/BLUE SHIELD | Source: Ambulatory Visit | Attending: Internal Medicine | Admitting: Internal Medicine

## 2018-09-03 ENCOUNTER — Other Ambulatory Visit: Payer: Self-pay | Admitting: Internal Medicine

## 2018-09-03 DIAGNOSIS — M48061 Spinal stenosis, lumbar region without neurogenic claudication: Secondary | ICD-10-CM | POA: Diagnosis not present

## 2018-09-03 DIAGNOSIS — M545 Low back pain, unspecified: Secondary | ICD-10-CM

## 2018-09-03 DIAGNOSIS — M791 Myalgia, unspecified site: Secondary | ICD-10-CM | POA: Diagnosis not present

## 2018-09-26 DIAGNOSIS — M545 Low back pain: Secondary | ICD-10-CM | POA: Diagnosis not present

## 2018-09-28 DIAGNOSIS — Z01419 Encounter for gynecological examination (general) (routine) without abnormal findings: Secondary | ICD-10-CM | POA: Diagnosis not present

## 2018-09-28 DIAGNOSIS — Z6826 Body mass index (BMI) 26.0-26.9, adult: Secondary | ICD-10-CM | POA: Diagnosis not present

## 2018-10-16 DIAGNOSIS — D225 Melanocytic nevi of trunk: Secondary | ICD-10-CM | POA: Diagnosis not present

## 2018-10-16 DIAGNOSIS — Z85820 Personal history of malignant melanoma of skin: Secondary | ICD-10-CM | POA: Diagnosis not present

## 2018-10-16 DIAGNOSIS — Z23 Encounter for immunization: Secondary | ICD-10-CM | POA: Diagnosis not present

## 2018-10-16 DIAGNOSIS — L814 Other melanin hyperpigmentation: Secondary | ICD-10-CM | POA: Diagnosis not present

## 2018-10-16 DIAGNOSIS — Z808 Family history of malignant neoplasm of other organs or systems: Secondary | ICD-10-CM | POA: Diagnosis not present

## 2019-01-14 DIAGNOSIS — E785 Hyperlipidemia, unspecified: Secondary | ICD-10-CM | POA: Diagnosis not present

## 2019-01-14 DIAGNOSIS — J4520 Mild intermittent asthma, uncomplicated: Secondary | ICD-10-CM | POA: Diagnosis not present

## 2019-05-06 DIAGNOSIS — Z85820 Personal history of malignant melanoma of skin: Secondary | ICD-10-CM | POA: Diagnosis not present

## 2019-05-06 DIAGNOSIS — Z808 Family history of malignant neoplasm of other organs or systems: Secondary | ICD-10-CM | POA: Diagnosis not present

## 2019-05-06 DIAGNOSIS — D225 Melanocytic nevi of trunk: Secondary | ICD-10-CM | POA: Diagnosis not present

## 2019-05-06 DIAGNOSIS — D485 Neoplasm of uncertain behavior of skin: Secondary | ICD-10-CM | POA: Diagnosis not present

## 2019-05-06 DIAGNOSIS — L814 Other melanin hyperpigmentation: Secondary | ICD-10-CM | POA: Diagnosis not present

## 2019-06-03 ENCOUNTER — Other Ambulatory Visit: Payer: Self-pay

## 2019-06-03 DIAGNOSIS — Z20822 Contact with and (suspected) exposure to covid-19: Secondary | ICD-10-CM

## 2019-06-04 LAB — NOVEL CORONAVIRUS, NAA: SARS-CoV-2, NAA: NOT DETECTED

## 2019-07-17 ENCOUNTER — Ambulatory Visit
Admission: RE | Admit: 2019-07-17 | Discharge: 2019-07-17 | Disposition: A | Discharge status: Home / Self Care | Payer: BLUE CROSS/BLUE SHIELD | Source: Ambulatory Visit | Attending: Internal Medicine | Admitting: Internal Medicine

## 2019-07-17 ENCOUNTER — Other Ambulatory Visit: Payer: Self-pay | Admitting: Internal Medicine

## 2019-07-17 DIAGNOSIS — R0789 Other chest pain: Secondary | ICD-10-CM

## 2019-07-17 DIAGNOSIS — Z23 Encounter for immunization: Secondary | ICD-10-CM | POA: Diagnosis not present

## 2019-07-17 DIAGNOSIS — J4520 Mild intermittent asthma, uncomplicated: Secondary | ICD-10-CM | POA: Diagnosis not present

## 2019-10-22 DIAGNOSIS — D225 Melanocytic nevi of trunk: Secondary | ICD-10-CM | POA: Diagnosis not present

## 2019-10-22 DIAGNOSIS — L814 Other melanin hyperpigmentation: Secondary | ICD-10-CM | POA: Diagnosis not present

## 2019-10-22 DIAGNOSIS — Z808 Family history of malignant neoplasm of other organs or systems: Secondary | ICD-10-CM | POA: Diagnosis not present

## 2019-10-22 DIAGNOSIS — D2261 Melanocytic nevi of right upper limb, including shoulder: Secondary | ICD-10-CM | POA: Diagnosis not present

## 2019-10-24 DIAGNOSIS — J302 Other seasonal allergic rhinitis: Secondary | ICD-10-CM | POA: Diagnosis not present

## 2019-11-14 DIAGNOSIS — E785 Hyperlipidemia, unspecified: Secondary | ICD-10-CM | POA: Diagnosis not present

## 2019-11-14 DIAGNOSIS — Z0000 Encounter for general adult medical examination without abnormal findings: Secondary | ICD-10-CM | POA: Diagnosis not present

## 2019-11-22 DIAGNOSIS — Z6826 Body mass index (BMI) 26.0-26.9, adult: Secondary | ICD-10-CM | POA: Diagnosis not present

## 2019-11-22 DIAGNOSIS — Z01419 Encounter for gynecological examination (general) (routine) without abnormal findings: Secondary | ICD-10-CM | POA: Diagnosis not present

## 2020-01-07 DIAGNOSIS — H5213 Myopia, bilateral: Secondary | ICD-10-CM | POA: Diagnosis not present

## 2020-01-07 DIAGNOSIS — H16223 Keratoconjunctivitis sicca, not specified as Sjogren's, bilateral: Secondary | ICD-10-CM | POA: Diagnosis not present

## 2020-02-25 DIAGNOSIS — M5442 Lumbago with sciatica, left side: Secondary | ICD-10-CM | POA: Diagnosis not present

## 2020-05-18 DIAGNOSIS — L814 Other melanin hyperpigmentation: Secondary | ICD-10-CM | POA: Diagnosis not present

## 2020-05-18 DIAGNOSIS — D2261 Melanocytic nevi of right upper limb, including shoulder: Secondary | ICD-10-CM | POA: Diagnosis not present

## 2020-05-18 DIAGNOSIS — Z808 Family history of malignant neoplasm of other organs or systems: Secondary | ICD-10-CM | POA: Diagnosis not present

## 2020-05-18 DIAGNOSIS — D225 Melanocytic nevi of trunk: Secondary | ICD-10-CM | POA: Diagnosis not present

## 2020-08-11 DIAGNOSIS — D485 Neoplasm of uncertain behavior of skin: Secondary | ICD-10-CM | POA: Diagnosis not present

## 2020-08-11 DIAGNOSIS — D2261 Melanocytic nevi of right upper limb, including shoulder: Secondary | ICD-10-CM | POA: Diagnosis not present

## 2020-11-11 DIAGNOSIS — D2261 Melanocytic nevi of right upper limb, including shoulder: Secondary | ICD-10-CM | POA: Diagnosis not present

## 2020-11-11 DIAGNOSIS — D225 Melanocytic nevi of trunk: Secondary | ICD-10-CM | POA: Diagnosis not present

## 2020-11-11 DIAGNOSIS — L814 Other melanin hyperpigmentation: Secondary | ICD-10-CM | POA: Diagnosis not present

## 2020-11-11 DIAGNOSIS — Z808 Family history of malignant neoplasm of other organs or systems: Secondary | ICD-10-CM | POA: Diagnosis not present

## 2021-01-18 DIAGNOSIS — U071 COVID-19: Secondary | ICD-10-CM | POA: Diagnosis not present

## 2021-02-02 DIAGNOSIS — Z0000 Encounter for general adult medical examination without abnormal findings: Secondary | ICD-10-CM | POA: Diagnosis not present

## 2021-02-02 DIAGNOSIS — E785 Hyperlipidemia, unspecified: Secondary | ICD-10-CM | POA: Diagnosis not present

## 2021-05-18 DIAGNOSIS — L814 Other melanin hyperpigmentation: Secondary | ICD-10-CM | POA: Diagnosis not present

## 2021-05-18 DIAGNOSIS — Z808 Family history of malignant neoplasm of other organs or systems: Secondary | ICD-10-CM | POA: Diagnosis not present

## 2021-05-18 DIAGNOSIS — D225 Melanocytic nevi of trunk: Secondary | ICD-10-CM | POA: Diagnosis not present

## 2021-05-18 DIAGNOSIS — D2261 Melanocytic nevi of right upper limb, including shoulder: Secondary | ICD-10-CM | POA: Diagnosis not present

## 2021-06-07 DIAGNOSIS — Z01419 Encounter for gynecological examination (general) (routine) without abnormal findings: Secondary | ICD-10-CM | POA: Diagnosis not present

## 2021-06-07 DIAGNOSIS — Z6826 Body mass index (BMI) 26.0-26.9, adult: Secondary | ICD-10-CM | POA: Diagnosis not present

## 2021-06-30 DIAGNOSIS — Z1231 Encounter for screening mammogram for malignant neoplasm of breast: Secondary | ICD-10-CM | POA: Diagnosis not present

## 2021-09-02 DIAGNOSIS — M25551 Pain in right hip: Secondary | ICD-10-CM | POA: Diagnosis not present

## 2021-11-08 DIAGNOSIS — D224 Melanocytic nevi of scalp and neck: Secondary | ICD-10-CM | POA: Diagnosis not present

## 2021-11-08 DIAGNOSIS — L820 Inflamed seborrheic keratosis: Secondary | ICD-10-CM | POA: Diagnosis not present

## 2021-11-08 DIAGNOSIS — D225 Melanocytic nevi of trunk: Secondary | ICD-10-CM | POA: Diagnosis not present

## 2021-11-08 DIAGNOSIS — D2261 Melanocytic nevi of right upper limb, including shoulder: Secondary | ICD-10-CM | POA: Diagnosis not present

## 2021-11-08 DIAGNOSIS — L814 Other melanin hyperpigmentation: Secondary | ICD-10-CM | POA: Diagnosis not present

## 2021-12-27 DIAGNOSIS — M5440 Lumbago with sciatica, unspecified side: Secondary | ICD-10-CM | POA: Diagnosis not present

## 2021-12-27 DIAGNOSIS — R739 Hyperglycemia, unspecified: Secondary | ICD-10-CM | POA: Diagnosis not present

## 2021-12-28 ENCOUNTER — Other Ambulatory Visit: Payer: Self-pay | Admitting: Internal Medicine

## 2021-12-28 DIAGNOSIS — M5440 Lumbago with sciatica, unspecified side: Secondary | ICD-10-CM

## 2022-01-18 DIAGNOSIS — M5440 Lumbago with sciatica, unspecified side: Secondary | ICD-10-CM | POA: Diagnosis not present

## 2022-01-26 DIAGNOSIS — M79605 Pain in left leg: Secondary | ICD-10-CM | POA: Diagnosis not present

## 2022-01-26 DIAGNOSIS — M5459 Other low back pain: Secondary | ICD-10-CM | POA: Diagnosis not present

## 2022-01-31 DIAGNOSIS — M79605 Pain in left leg: Secondary | ICD-10-CM | POA: Diagnosis not present

## 2022-01-31 DIAGNOSIS — M5459 Other low back pain: Secondary | ICD-10-CM | POA: Diagnosis not present

## 2022-02-10 DIAGNOSIS — Z13220 Encounter for screening for lipoid disorders: Secondary | ICD-10-CM | POA: Diagnosis not present

## 2022-02-10 DIAGNOSIS — E785 Hyperlipidemia, unspecified: Secondary | ICD-10-CM | POA: Diagnosis not present

## 2022-02-10 DIAGNOSIS — Z0000 Encounter for general adult medical examination without abnormal findings: Secondary | ICD-10-CM | POA: Diagnosis not present

## 2022-02-14 DIAGNOSIS — M5459 Other low back pain: Secondary | ICD-10-CM | POA: Diagnosis not present

## 2022-02-14 DIAGNOSIS — M79605 Pain in left leg: Secondary | ICD-10-CM | POA: Diagnosis not present

## 2022-02-25 DIAGNOSIS — M5459 Other low back pain: Secondary | ICD-10-CM | POA: Diagnosis not present

## 2022-02-25 DIAGNOSIS — M79605 Pain in left leg: Secondary | ICD-10-CM | POA: Diagnosis not present

## 2022-03-25 DIAGNOSIS — M79605 Pain in left leg: Secondary | ICD-10-CM | POA: Diagnosis not present

## 2022-03-25 DIAGNOSIS — M5459 Other low back pain: Secondary | ICD-10-CM | POA: Diagnosis not present

## 2022-04-07 DIAGNOSIS — M79605 Pain in left leg: Secondary | ICD-10-CM | POA: Diagnosis not present

## 2022-04-07 DIAGNOSIS — M5459 Other low back pain: Secondary | ICD-10-CM | POA: Diagnosis not present

## 2022-04-12 DIAGNOSIS — E785 Hyperlipidemia, unspecified: Secondary | ICD-10-CM | POA: Diagnosis not present

## 2022-04-21 DIAGNOSIS — M5459 Other low back pain: Secondary | ICD-10-CM | POA: Diagnosis not present

## 2022-04-21 DIAGNOSIS — M79605 Pain in left leg: Secondary | ICD-10-CM | POA: Diagnosis not present

## 2022-05-17 DIAGNOSIS — M5459 Other low back pain: Secondary | ICD-10-CM | POA: Diagnosis not present

## 2022-05-17 DIAGNOSIS — M79605 Pain in left leg: Secondary | ICD-10-CM | POA: Diagnosis not present

## 2022-06-06 DIAGNOSIS — H5711 Ocular pain, right eye: Secondary | ICD-10-CM | POA: Diagnosis not present

## 2022-06-06 DIAGNOSIS — H10411 Chronic giant papillary conjunctivitis, right eye: Secondary | ICD-10-CM | POA: Diagnosis not present

## 2022-06-06 DIAGNOSIS — H00011 Hordeolum externum right upper eyelid: Secondary | ICD-10-CM | POA: Diagnosis not present

## 2022-07-12 DIAGNOSIS — F4322 Adjustment disorder with anxiety: Secondary | ICD-10-CM | POA: Diagnosis not present

## 2022-07-14 DIAGNOSIS — D225 Melanocytic nevi of trunk: Secondary | ICD-10-CM | POA: Diagnosis not present

## 2022-07-14 DIAGNOSIS — D2261 Melanocytic nevi of right upper limb, including shoulder: Secondary | ICD-10-CM | POA: Diagnosis not present

## 2022-07-14 DIAGNOSIS — D224 Melanocytic nevi of scalp and neck: Secondary | ICD-10-CM | POA: Diagnosis not present

## 2022-07-14 DIAGNOSIS — L814 Other melanin hyperpigmentation: Secondary | ICD-10-CM | POA: Diagnosis not present

## 2022-07-19 DIAGNOSIS — F4322 Adjustment disorder with anxiety: Secondary | ICD-10-CM | POA: Diagnosis not present

## 2022-08-12 DIAGNOSIS — F4322 Adjustment disorder with anxiety: Secondary | ICD-10-CM | POA: Diagnosis not present

## 2022-08-15 DIAGNOSIS — E785 Hyperlipidemia, unspecified: Secondary | ICD-10-CM | POA: Diagnosis not present

## 2022-08-15 DIAGNOSIS — Z6824 Body mass index (BMI) 24.0-24.9, adult: Secondary | ICD-10-CM | POA: Diagnosis not present

## 2022-08-15 DIAGNOSIS — J302 Other seasonal allergic rhinitis: Secondary | ICD-10-CM | POA: Diagnosis not present

## 2022-08-15 DIAGNOSIS — Z23 Encounter for immunization: Secondary | ICD-10-CM | POA: Diagnosis not present

## 2022-08-15 DIAGNOSIS — Z01419 Encounter for gynecological examination (general) (routine) without abnormal findings: Secondary | ICD-10-CM | POA: Diagnosis not present

## 2022-08-15 DIAGNOSIS — Z1231 Encounter for screening mammogram for malignant neoplasm of breast: Secondary | ICD-10-CM | POA: Diagnosis not present

## 2022-08-15 DIAGNOSIS — H5213 Myopia, bilateral: Secondary | ICD-10-CM | POA: Diagnosis not present

## 2022-08-15 DIAGNOSIS — Z124 Encounter for screening for malignant neoplasm of cervix: Secondary | ICD-10-CM | POA: Diagnosis not present

## 2022-08-15 DIAGNOSIS — H04123 Dry eye syndrome of bilateral lacrimal glands: Secondary | ICD-10-CM | POA: Diagnosis not present

## 2022-08-15 DIAGNOSIS — J40 Bronchitis, not specified as acute or chronic: Secondary | ICD-10-CM | POA: Diagnosis not present

## 2022-08-15 DIAGNOSIS — J4520 Mild intermittent asthma, uncomplicated: Secondary | ICD-10-CM | POA: Diagnosis not present

## 2022-08-15 DIAGNOSIS — Z1151 Encounter for screening for human papillomavirus (HPV): Secondary | ICD-10-CM | POA: Diagnosis not present

## 2022-08-26 DIAGNOSIS — F4322 Adjustment disorder with anxiety: Secondary | ICD-10-CM | POA: Diagnosis not present

## 2022-09-09 DIAGNOSIS — F4322 Adjustment disorder with anxiety: Secondary | ICD-10-CM | POA: Diagnosis not present

## 2022-10-05 DIAGNOSIS — F4322 Adjustment disorder with anxiety: Secondary | ICD-10-CM | POA: Diagnosis not present

## 2022-10-14 DIAGNOSIS — F4322 Adjustment disorder with anxiety: Secondary | ICD-10-CM | POA: Diagnosis not present

## 2022-12-20 DIAGNOSIS — F4322 Adjustment disorder with anxiety: Secondary | ICD-10-CM | POA: Diagnosis not present

## 2023-01-03 DIAGNOSIS — L814 Other melanin hyperpigmentation: Secondary | ICD-10-CM | POA: Diagnosis not present

## 2023-01-03 DIAGNOSIS — D224 Melanocytic nevi of scalp and neck: Secondary | ICD-10-CM | POA: Diagnosis not present

## 2023-01-03 DIAGNOSIS — D225 Melanocytic nevi of trunk: Secondary | ICD-10-CM | POA: Diagnosis not present

## 2023-01-03 DIAGNOSIS — D2261 Melanocytic nevi of right upper limb, including shoulder: Secondary | ICD-10-CM | POA: Diagnosis not present

## 2023-05-05 DIAGNOSIS — F4322 Adjustment disorder with anxiety: Secondary | ICD-10-CM | POA: Diagnosis not present

## 2023-06-23 DIAGNOSIS — H73891 Other specified disorders of tympanic membrane, right ear: Secondary | ICD-10-CM | POA: Diagnosis not present

## 2023-06-23 DIAGNOSIS — H8111 Benign paroxysmal vertigo, right ear: Secondary | ICD-10-CM | POA: Diagnosis not present

## 2023-06-23 DIAGNOSIS — H6991 Unspecified Eustachian tube disorder, right ear: Secondary | ICD-10-CM | POA: Diagnosis not present

## 2023-06-23 DIAGNOSIS — R110 Nausea: Secondary | ICD-10-CM | POA: Diagnosis not present

## 2023-07-03 DIAGNOSIS — D224 Melanocytic nevi of scalp and neck: Secondary | ICD-10-CM | POA: Diagnosis not present

## 2023-07-03 DIAGNOSIS — L820 Inflamed seborrheic keratosis: Secondary | ICD-10-CM | POA: Diagnosis not present

## 2023-07-03 DIAGNOSIS — L814 Other melanin hyperpigmentation: Secondary | ICD-10-CM | POA: Diagnosis not present

## 2023-07-03 DIAGNOSIS — D2261 Melanocytic nevi of right upper limb, including shoulder: Secondary | ICD-10-CM | POA: Diagnosis not present

## 2023-07-03 DIAGNOSIS — D225 Melanocytic nevi of trunk: Secondary | ICD-10-CM | POA: Diagnosis not present

## 2023-08-16 DIAGNOSIS — F4322 Adjustment disorder with anxiety: Secondary | ICD-10-CM | POA: Diagnosis not present

## 2023-08-21 DIAGNOSIS — Z01419 Encounter for gynecological examination (general) (routine) without abnormal findings: Secondary | ICD-10-CM | POA: Diagnosis not present

## 2023-08-21 DIAGNOSIS — Z6822 Body mass index (BMI) 22.0-22.9, adult: Secondary | ICD-10-CM | POA: Diagnosis not present

## 2023-08-21 DIAGNOSIS — Z13220 Encounter for screening for lipoid disorders: Secondary | ICD-10-CM | POA: Diagnosis not present

## 2023-08-21 DIAGNOSIS — Z1329 Encounter for screening for other suspected endocrine disorder: Secondary | ICD-10-CM | POA: Diagnosis not present

## 2023-08-21 DIAGNOSIS — Z1231 Encounter for screening mammogram for malignant neoplasm of breast: Secondary | ICD-10-CM | POA: Diagnosis not present

## 2023-08-21 DIAGNOSIS — Z130 Encounter for screening for diseases of the blood and blood-forming organs and certain disorders involving the immune mechanism: Secondary | ICD-10-CM | POA: Diagnosis not present

## 2023-08-21 DIAGNOSIS — Z131 Encounter for screening for diabetes mellitus: Secondary | ICD-10-CM | POA: Diagnosis not present

## 2023-08-22 DIAGNOSIS — F419 Anxiety disorder, unspecified: Secondary | ICD-10-CM | POA: Diagnosis not present

## 2023-10-02 DIAGNOSIS — F4322 Adjustment disorder with anxiety: Secondary | ICD-10-CM | POA: Diagnosis not present

## 2023-10-25 DIAGNOSIS — F4322 Adjustment disorder with anxiety: Secondary | ICD-10-CM | POA: Diagnosis not present

## 2023-10-31 DIAGNOSIS — F4322 Adjustment disorder with anxiety: Secondary | ICD-10-CM | POA: Diagnosis not present

## 2023-11-07 DIAGNOSIS — F4322 Adjustment disorder with anxiety: Secondary | ICD-10-CM | POA: Diagnosis not present

## 2023-11-22 DIAGNOSIS — F4322 Adjustment disorder with anxiety: Secondary | ICD-10-CM | POA: Diagnosis not present

## 2023-12-11 DIAGNOSIS — F4322 Adjustment disorder with anxiety: Secondary | ICD-10-CM | POA: Diagnosis not present

## 2024-01-15 DIAGNOSIS — D2261 Melanocytic nevi of right upper limb, including shoulder: Secondary | ICD-10-CM | POA: Diagnosis not present

## 2024-01-15 DIAGNOSIS — D224 Melanocytic nevi of scalp and neck: Secondary | ICD-10-CM | POA: Diagnosis not present

## 2024-01-15 DIAGNOSIS — D225 Melanocytic nevi of trunk: Secondary | ICD-10-CM | POA: Diagnosis not present

## 2024-01-15 DIAGNOSIS — L814 Other melanin hyperpigmentation: Secondary | ICD-10-CM | POA: Diagnosis not present

## 2024-01-21 ENCOUNTER — Encounter (HOSPITAL_COMMUNITY): Payer: Self-pay

## 2024-01-21 ENCOUNTER — Emergency Department (HOSPITAL_COMMUNITY)
Admission: EM | Admit: 2024-01-21 | Discharge: 2024-01-21 | Disposition: A | Discharge status: Home / Self Care | Payer: Self-pay | Attending: Emergency Medicine | Admitting: Emergency Medicine

## 2024-01-21 ENCOUNTER — Emergency Department (HOSPITAL_COMMUNITY): Payer: Self-pay

## 2024-01-21 DIAGNOSIS — N939 Abnormal uterine and vaginal bleeding, unspecified: Secondary | ICD-10-CM

## 2024-01-21 DIAGNOSIS — Z3A01 Less than 8 weeks gestation of pregnancy: Secondary | ICD-10-CM | POA: Diagnosis not present

## 2024-01-21 DIAGNOSIS — O209 Hemorrhage in early pregnancy, unspecified: Secondary | ICD-10-CM | POA: Diagnosis not present

## 2024-01-21 DIAGNOSIS — Z3490 Encounter for supervision of normal pregnancy, unspecified, unspecified trimester: Secondary | ICD-10-CM

## 2024-01-21 LAB — COMPREHENSIVE METABOLIC PANEL WITH GFR
ALT: 15 U/L (ref 0–44)
AST: 23 U/L (ref 15–41)
Albumin: 4.1 g/dL (ref 3.5–5.0)
Alkaline Phosphatase: 53 U/L (ref 38–126)
Anion gap: 9 (ref 5–15)
BUN: 9 mg/dL (ref 6–20)
CO2: 21 mmol/L — ABNORMAL LOW (ref 22–32)
Calcium: 8.5 mg/dL — ABNORMAL LOW (ref 8.9–10.3)
Chloride: 108 mmol/L (ref 98–111)
Creatinine, Ser: 0.7 mg/dL (ref 0.44–1.00)
GFR, Estimated: >60 mL/min (ref >60)
Glucose, Bld: 130 mg/dL — ABNORMAL HIGH (ref 70–99)
Potassium: 4 mmol/L (ref 3.5–5.1)
Sodium: 138 mmol/L (ref 135–145)
Total Bilirubin: 0.9 mg/dL (ref 0.0–1.2)
Total Protein: 6.8 g/dL (ref 6.5–8.1)

## 2024-01-21 LAB — URINALYSIS, ROUTINE W REFLEX MICROSCOPIC
Bilirubin Urine: NEGATIVE
Glucose, UA: NEGATIVE mg/dL
Ketones, ur: NEGATIVE mg/dL
Leukocytes,Ua: NEGATIVE
Nitrite: NEGATIVE
Protein, ur: NEGATIVE mg/dL
RBC / HPF: >50 RBC/hpf (ref 0–5)
Specific Gravity, Urine: 1.019 (ref 1.005–1.030)
pH: 5 (ref 5.0–8.0)

## 2024-01-21 LAB — CBC
HCT: 43.2 % (ref 36.0–46.0)
Hemoglobin: 13.6 g/dL (ref 12.0–15.0)
MCH: 29.8 pg (ref 26.0–34.0)
MCHC: 31.5 g/dL (ref 30.0–36.0)
MCV: 94.5 fL (ref 80.0–100.0)
Platelets: 249 10*3/uL (ref 150–400)
RBC: 4.57 MIL/uL (ref 3.87–5.11)
RDW: 11.8 % (ref 11.5–15.5)
WBC: 13.2 10*3/uL — ABNORMAL HIGH (ref 4.0–10.5)
nRBC: 0 % (ref 0.0–0.2)

## 2024-01-21 LAB — HCG, SERUM, QUALITATIVE: Preg, Serum: POSITIVE — AB

## 2024-01-21 LAB — HCG, QUANTITATIVE, PREGNANCY: hCG, Beta Chain, Quant, S: 660 m[IU]/mL — ABNORMAL HIGH (ref <5)

## 2024-01-21 LAB — LIPASE, BLOOD: Lipase: 35 U/L (ref 11–51)

## 2024-01-21 NOTE — ED Triage Notes (Signed)
 Pt states that she was driving and began to have lower abd pain, had a BM and felt like she was going to pass out with some nausea.

## 2024-01-21 NOTE — Discharge Instructions (Signed)
 Your quantitative beta-hCG was 660 today.  There is a possibility of ectopic pregnancy and you were offered a pelvic ultrasound which you have deferred.  Risk of this was explained to you.  Please call your gynecologist tomorrow to schedule follow-up for a repeat blood pregnancy test.  Please go to [facility] if you change your mind about the ultrasound

## 2024-01-21 NOTE — ED Provider Notes (Addendum)
 [name] Provider Note   CSN: [id number] Arrival date & time: 01/21/24  1919     Patient presents with: Abdominal Pain   Lauren Gibbs is a 43 y.o. female.   43 year old female presents with sudden onset of lower abdominal crampy.  Patient started menstrual cycle today.  States that her period about 10 days late.  States that pain was so severe that admitted became diaphoretic and nauseated.  She has not had any fever or chills or did not have any emesis.  States that she did move her bowels and felt much better.  Is currently asymptomatic at this time.  No prior history of same.  No prior history of surgery       Prior to Admission medications   Medication Sig Start Date End Date Taking? Authorizing Provider  albuterol  (PROVENTIL  HFA;VENTOLIN  HFA) 108 (90 BASE) MCG/ACT inhaler Inhale 2 puffs into the lungs every 6 (six) hours as needed for wheezing or shortness of breath. 06/27/14 01/21/24  Ruthann Cover, MD  cyclobenzaprine  (FLEXERIL ) 10 MG tablet Take 0.5-1 tablets (5-10 mg total) 3 (three) times daily as needed by mouth. 06/14/17   Vivica Grounds D, PA  doxycycline (VIBRA-TABS) 100 MG tablet Take 100 mg 2 (two) times daily by mouth. for 10 days 06/13/17   [provider]  meloxicam  (MOBIC ) 15 MG tablet Take 1 tablet (15 mg total) daily by mouth. 06/14/17   Vivica Grounds D, PA  montelukast  (SINGULAIR ) 10 MG tablet Take 1 tablet (10 mg total) by mouth at bedtime. 07/25/17   Vivica Grounds D, PA    Allergies: Amoxicillin, Cefaclor, Keflex [cephalexin], and Sulfa antibiotics    Review of Systems  All other systems reviewed and are negative.   Updated Vital Signs BP 125/82   Pulse 67   Temp (!) 97.5 F (36.4 C) (Oral)   Resp 17   LMP 01/21/2024   SpO2 97%   Physical Exam Vitals and nursing note reviewed.  Constitutional:      General: She is not in acute distress.    Appearance: Normal appearance.  She is well-developed. She is not toxic-appearing.  HENT:     Head: Normocephalic and atraumatic.   Eyes:     General: Lids are normal.     Conjunctiva/sclera: Conjunctivae normal.     Pupils: Pupils are equal, round, and reactive to light.   Neck:     Thyroid : No thyroid  mass.     Trachea: No tracheal deviation.   Cardiovascular:     Rate and Rhythm: Normal rate and regular rhythm.     Heart sounds: Normal heart sounds. No murmur heard.    No gallop.  Pulmonary:     Effort: Pulmonary effort is normal. No respiratory distress.     Breath sounds: Normal breath sounds. No stridor. No decreased breath sounds, wheezing, rhonchi or rales.  Abdominal:     General: There is no distension.     Palpations: Abdomen is soft.     Tenderness: There is no abdominal tenderness. There is no guarding or rebound.   Musculoskeletal:        General: No tenderness. Normal range of motion.     Cervical back: Normal range of motion and neck supple.   Skin:    General: Skin is warm and dry.     Findings: No abrasion or rash.   Neurological:     Mental Status: She is alert and oriented to person, place,  and time. Mental status is at baseline.     GCS: GCS eye subscore is 4. GCS verbal subscore is 5. GCS motor subscore is 6.     Cranial Nerves: No cranial nerve deficit.     Sensory: No sensory deficit.     Motor: Motor function is intact.   Psychiatric:        Attention and Perception: Attention normal.        Speech: Speech normal.        Behavior: Behavior normal.     (all labs ordered are listed, but only abnormal results are displayed) Labs Reviewed  CBC - Abnormal; Notable for the following components:      Result Value   WBC 13.2 (*)    All other components within normal limits  LIPASE, BLOOD  COMPREHENSIVE METABOLIC PANEL WITH GFR  URINALYSIS, ROUTINE W REFLEX MICROSCOPIC  HCG, SERUM, QUALITATIVE    EKG: None  Radiology: No results found.   Procedures   Medications  Ordered in the ED - No data to display                                  Medical Decision Making Amount and/or Complexity of Data Reviewed Labs: ordered. Radiology: ordered.   Patient's urine pregnancy test was positive.  Patient states that she is 10 days late for her period her quantitative beta was 660.  Patient has no abdominal pain at this time.  I still would like for the patient to have a pelvic ultrasound which she has deferred at this time.  Patient understands the risks associated with this and her husband is at bedside.  Patient deferred pelvic exam.  Patient has a GYN doctor and states that she will follow-up with her tomorrow. Patient encouraged to return if she changes her mind    Final diagnoses:  None    ED Discharge Orders     None          Lind Repine, MD 01/21/24 2151    Lind Repine, MD 01/21/24 2153

## 2024-01-22 DIAGNOSIS — O360110 Maternal care for anti-D [Rh] antibodies, first trimester, not applicable or unspecified: Secondary | ICD-10-CM | POA: Diagnosis not present

## 2024-01-22 DIAGNOSIS — N911 Secondary amenorrhea: Secondary | ICD-10-CM | POA: Diagnosis not present

## 2024-01-24 DIAGNOSIS — O029 Abnormal product of conception, unspecified: Secondary | ICD-10-CM | POA: Diagnosis not present

## 2024-01-24 DIAGNOSIS — Z6791 Unspecified blood type, Rh negative: Secondary | ICD-10-CM | POA: Diagnosis not present

## 2024-01-30 DIAGNOSIS — O039 Complete or unspecified spontaneous abortion without complication: Secondary | ICD-10-CM | POA: Diagnosis not present

## 2024-01-30 DIAGNOSIS — F4322 Adjustment disorder with anxiety: Secondary | ICD-10-CM | POA: Diagnosis not present

## 2024-02-02 DIAGNOSIS — O039 Complete or unspecified spontaneous abortion without complication: Secondary | ICD-10-CM | POA: Diagnosis not present

## 2024-02-21 DIAGNOSIS — Z0000 Encounter for general adult medical examination without abnormal findings: Secondary | ICD-10-CM | POA: Diagnosis not present

## 2024-02-21 DIAGNOSIS — J302 Other seasonal allergic rhinitis: Secondary | ICD-10-CM | POA: Diagnosis not present

## 2024-02-21 DIAGNOSIS — E785 Hyperlipidemia, unspecified: Secondary | ICD-10-CM | POA: Diagnosis not present

## 2024-02-21 DIAGNOSIS — Z23 Encounter for immunization: Secondary | ICD-10-CM | POA: Diagnosis not present

## 2024-07-02 DIAGNOSIS — D224 Melanocytic nevi of scalp and neck: Secondary | ICD-10-CM | POA: Diagnosis not present

## 2024-07-02 DIAGNOSIS — D225 Melanocytic nevi of trunk: Secondary | ICD-10-CM | POA: Diagnosis not present

## 2024-07-02 DIAGNOSIS — D2261 Melanocytic nevi of right upper limb, including shoulder: Secondary | ICD-10-CM | POA: Diagnosis not present

## 2024-07-02 DIAGNOSIS — L814 Other melanin hyperpigmentation: Secondary | ICD-10-CM | POA: Diagnosis not present
